# Patient Record
Sex: Female | Born: 1955
Health system: Southern US, Community
[De-identification: ages and names within clinical notes are randomized; demographics above are authoritative.]

## PROBLEM LIST (undated history)

## (undated) DIAGNOSIS — I341 Nonrheumatic mitral (valve) prolapse: Secondary | ICD-10-CM

## (undated) DIAGNOSIS — Z9989 Dependence on other enabling machines and devices: Secondary | ICD-10-CM

## (undated) DIAGNOSIS — G8114 Spastic hemiplegia affecting left nondominant side: Secondary | ICD-10-CM

## (undated) DIAGNOSIS — Z972 Presence of dental prosthetic device (complete) (partial): Secondary | ICD-10-CM

## (undated) DIAGNOSIS — R519 Headache, unspecified: Secondary | ICD-10-CM

## (undated) DIAGNOSIS — Z8489 Family history of other specified conditions: Secondary | ICD-10-CM

## (undated) DIAGNOSIS — N819 Female genital prolapse, unspecified: Secondary | ICD-10-CM

## (undated) HISTORY — DX: Nonrheumatic mitral (valve) prolapse: I34.1

## (undated) HISTORY — PX: FINGER SURGERY: SHX640

## (undated) HISTORY — PX: TUBAL LIGATION: SHX77

## (undated) HISTORY — PX: GALLBLADDER SURGERY: SHX652

---
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
## 1995-01-14 HISTORY — PX: TUBAL LIGATION: SHX77

## 2011-01-14 HISTORY — PX: CHOLECYSTECTOMY: SHX55

## 2015-01-14 HISTORY — PX: FINGER SURGERY: SHX640

## 2015-05-31 ENCOUNTER — Ambulatory Visit (INDEPENDENT_AMBULATORY_CARE_PROVIDER_SITE_OTHER): Payer: BLUE CROSS/BLUE SHIELD | Admitting: Sports Medicine

## 2015-05-31 ENCOUNTER — Ambulatory Visit
Admission: RE | Admit: 2015-05-31 | Discharge: 2015-05-31 | Disposition: A | Discharge status: Home / Self Care | Payer: BLUE CROSS/BLUE SHIELD | Source: Ambulatory Visit | Attending: Sports Medicine | Admitting: Sports Medicine

## 2015-05-31 ENCOUNTER — Encounter: Payer: Self-pay | Admitting: Sports Medicine

## 2015-05-31 VITALS — BP 110/70 | Ht 66.0 in | Wt 142.0 lb

## 2015-05-31 DIAGNOSIS — R29898 Other symptoms and signs involving the musculoskeletal system: Secondary | ICD-10-CM

## 2015-05-31 NOTE — Progress Notes (Signed)
   Subjective:    Patient ID: Heather [name], female    DOB: [birth year], 60 y.o.   MRN: [id number]  HPI  CC: left leg weakness  Heather [name] is a very pleasant 60-y.o. female who presents with complaints of gradually worsening left leg weakness.  She states that her symptoms have been present for the past 5 years.  She denies any trauma or inciting event related to the onset of her symptoms.  She notes that her left leg fatigues easily with activity (i.e., walking for long distances).  Associated symptoms include some occasional left-sided, "aching" low back pain; she denies any pain or numbness/tingling in her left leg.  She has noticed that her left leg seems "smaller" than her right leg and that she occasionally stumbles after walking long distances due to the perceived weakness in her left leg.  She has continued to stay active despite her symptoms.  She has not taken any medication for her symptoms.  She does report a history of limb length discrepancy in her family and was recently told by her PCP that her left leg is slightly shorter than her right leg.   Past medical and surgical history unremarkable. Medications reviewed. Allergies reviewed.   Review of Systems As noted in HPI.    Objective:   Physical Exam  Well-nourished, well-developed.  Alert and oriented x3.  Vital signs reviewed.  Left leg: Visible atrophy of calf and quadriceps muscles as compared to right side.  Upon measurement, the left leg is 38cm at the mid-thigh (compared to 43cm on the right side) and 34cm at the calf (compared to 36cm on the right side).  No tenderness to palpation.  Left leg is slightly shorter than the right leg by comparison at the level of the medial malleoli.  Resisted hamstring flexion on the left is 4+/5; 5/5 on the right.  Quad strength 5/5 bilaterally.  Left side is weak on resisted dorsiflexion and extensor hallucis longus testing as compared to the right.  Visible fasciculations in the left thigh.   Mild hyperreflexia at patellar and Achilles tendons as compared to right side.  Left lower extremity appears grossly neurovascularly intact.  Lumbar spine: Full lumbar spine range of motion without discomfort or limitation.     Assessment & Plan:   Left lower extremity weakness with concern for L5-S1 nerve root compression  Patient's symptoms seem to mostly follow distribution for L5-S1 nerve involvement, though this is not completely clear.  We will send the patient for lumbar spine x-rays and lumbar spine MRI in hopes of better deciphering the etiology of her symptoms.  If MRI shows no cause for patient's symptoms, will likely need to send her to Neurology for further evaluation and possible nerve conduction studies.  We will follow-up with her by phone once her imaging results are received.  Jenna E. Anola Gurney, M.D.  Patient seen and evaluated with the resident. I agree with the above plan of care. Patient presents today with chronic left leg weakness and obvious atrophy on exam. I'm concerned that she has a chronic lumbar disc herniation so I'm going to get an MRI of her lumbar spine. If lumbar disc herniation is confirmed, she will likely need neurosurgical referral. If the MRI is unremarkable, then I would consider referral to neurology. I think she is okay to continue with activity as her symptoms allow since this is a chronic condition.

## 2015-06-07 ENCOUNTER — Ambulatory Visit
Admission: RE | Admit: 2015-06-07 | Discharge: 2015-06-07 | Disposition: A | Discharge status: Home / Self Care | Payer: BLUE CROSS/BLUE SHIELD | Source: Ambulatory Visit | Attending: Sports Medicine | Admitting: Sports Medicine

## 2015-06-07 DIAGNOSIS — R29898 Other symptoms and signs involving the musculoskeletal system: Secondary | ICD-10-CM

## 2015-06-12 ENCOUNTER — Telehealth: Payer: Self-pay | Admitting: Sports Medicine

## 2015-06-12 NOTE — Telephone Encounter (Signed)
I spoke with the patient on the phone today after reviewing the x-rays and the MRI of her lumbar spine. MRI does show a broad-based extraforaminal disc protrusion at L3-L4 on the left which could contribute to L3 nerve root encroachment. She also has a grade 1 spondylolisthesis at L4-L5 but no significant spinal or foraminal encroachment. I'm not totally convinced that the disc protrusion at L3-L4 explains the degree of weakness and atrophy that she has in her left leg. Therefore, I would like to proceed with an EMG/nerve conduction study to evaluate further. I will call her with the results of that study once available at which point we will delineate further workup and treatment.

## 2015-07-10 DIAGNOSIS — S61216A Laceration without foreign body of right little finger without damage to nail, initial encounter: Secondary | ICD-10-CM | POA: Diagnosis not present

## 2015-07-10 DIAGNOSIS — S66126A Laceration of flexor muscle, fascia and tendon of right little finger at wrist and hand level, initial encounter: Secondary | ICD-10-CM | POA: Diagnosis not present

## 2015-07-11 DIAGNOSIS — S66126A Laceration of flexor muscle, fascia and tendon of right little finger at wrist and hand level, initial encounter: Secondary | ICD-10-CM | POA: Diagnosis not present

## 2015-07-11 DIAGNOSIS — S61216A Laceration without foreign body of right little finger without damage to nail, initial encounter: Secondary | ICD-10-CM | POA: Diagnosis not present

## 2015-07-12 ENCOUNTER — Encounter: Payer: Self-pay | Admitting: Neurology

## 2015-07-12 ENCOUNTER — Ambulatory Visit (INDEPENDENT_AMBULATORY_CARE_PROVIDER_SITE_OTHER): Payer: Self-pay | Admitting: Neurology

## 2015-07-12 ENCOUNTER — Ambulatory Visit (INDEPENDENT_AMBULATORY_CARE_PROVIDER_SITE_OTHER): Payer: BLUE CROSS/BLUE SHIELD | Admitting: Neurology

## 2015-07-12 DIAGNOSIS — R29898 Other symptoms and signs involving the musculoskeletal system: Secondary | ICD-10-CM

## 2015-07-12 NOTE — Procedures (Signed)
     HISTORY:  Heather [name] is a 60 year old patient with a history of problems with limping on the left leg, hyperextension of the left knee, some weakness of the leg. The patient has possible left L3 nerve root impingement by MRI of the lumbar spine. The patient is being evaluated for a possible lumbar radiculopathy.  NERVE CONDUCTION STUDIES:  Nerve conduction studies were performed on both lower extremities. The distal motor latencies and motor amplitudes for the peroneal and posterior tibial nerves were within normal limits. The nerve conduction velocities for these nerves were also normal. The H reflex latencies were normal. The sensory latencies for the peroneal nerves were within normal limits.   EMG STUDIES:  EMG study was performed on the left lower extremity:  The tibialis anterior muscle reveals 2 to 4K motor units with full recruitment. No fibrillations or positive waves were seen. The peroneus tertius muscle reveals 2 to 4K motor units with full recruitment. No fibrillations or positive waves were seen. The medial gastrocnemius muscle reveals 1 to 3K motor units with full recruitment. No fibrillations or positive waves were seen. The vastus lateralis muscle reveals 2 to 4K motor units with full recruitment. No fibrillations or positive waves were seen. The iliopsoas muscle reveals 2 to 4K motor units with full recruitment. No fibrillations or positive waves were seen. The biceps femoris muscle (long head) reveals 2 to 4K motor units with full recruitment. No fibrillations or positive waves were seen. The lumbosacral paraspinal muscles were tested at 3 levels, and revealed no abnormalities of insertional activity at all 3 levels tested. There was good relaxation.   IMPRESSION:  Nerve conduction studies done on both lower extremities were within normal limits. No evidence of a peripheral neuropathy is seen. EMG evaluation of the left lower extremity is unremarkable, without  evidence of an overlying lumbar radiculopathy.  Heather Palau MD 07/12/2015 11:05 AM  [facility] [address] [place], [place] [address]  Phone [phone] Fax [phone]

## 2015-07-12 NOTE — Progress Notes (Signed)
Please refer to EMG and nerve conduction study procedure note. 

## 2015-07-16 DIAGNOSIS — M25641 Stiffness of right hand, not elsewhere classified: Secondary | ICD-10-CM | POA: Diagnosis not present

## 2015-07-24 DIAGNOSIS — S66126A Laceration of flexor muscle, fascia and tendon of right little finger at wrist and hand level, initial encounter: Secondary | ICD-10-CM | POA: Diagnosis not present

## 2015-07-31 ENCOUNTER — Ambulatory Visit: Payer: BLUE CROSS/BLUE SHIELD | Admitting: Sports Medicine

## 2015-08-01 ENCOUNTER — Ambulatory Visit (INDEPENDENT_AMBULATORY_CARE_PROVIDER_SITE_OTHER): Payer: BLUE CROSS/BLUE SHIELD | Admitting: Sports Medicine

## 2015-08-01 ENCOUNTER — Encounter: Payer: Self-pay | Admitting: Sports Medicine

## 2015-08-01 VITALS — BP 123/74 | HR 68 | Ht 66.0 in | Wt 140.0 lb

## 2015-08-01 DIAGNOSIS — R29898 Other symptoms and signs involving the musculoskeletal system: Secondary | ICD-10-CM | POA: Diagnosis not present

## 2015-08-01 NOTE — Progress Notes (Signed)
   Subjective:    Patient ID: Heather [name], female    DOB: 1955-04-21, 60 y.o.   MRN: [id number]  HPI  Heather [name] is here for follow-up for left leg weakness. She denies any pain numbness or tingling, but states she has to swing her leg to the outside after walking for a long period. Her father has similar symptoms and also has to "drag his left leg". During her last visit she was noted to have some atrophy of her left thigh and calf as well as weakness compared to the right side. MRI lumbar spine did not show signs of significant nerve impingement. Recent EMG/ NCS was unremarkable. Patient states weakness has been going on for 5 years. Only significant injury to this leg in the past was a patellar fracture which was treated conservatively with knee immobilization but no post injury physical therapy was ordered. Patient's daughter is present for today's visit as well as.  FH: No history of nerve or muscle disease  MRI Lumbar spine IMPRESSION: 1. Broad-based extraforaminal disc protrusion on the left at L3-4 could contribute to extraforaminal left L3 nerve root encroachment. 2. Moderate facet hypertrophy at L4-5 with a resulting grade 1 anterolisthesis and mild disc bulging. No associated spinal stenosis or nerve root encroachment. 3. Mild disc degeneration at L5-S1 without resulting spinal stenosis or nerve root encroachment.  Review of Systems Denies numbness, tingling, pain, swelling, rash    Objective:   Physical Exam Patient appears well in no acute distress. Has a brace on right hand from recent surgery. Left thigh notably smaller than right. No obvious deformity, ecchymoses, or swelling of left lower extremity. Strength:  Hip flexion: 5/5 right, 4/5 left Hip abduction: 5/5 bilaterally Hip adduction: 5/5 bilaterally Knee extension: 5/5 bilaterally Knee flexion: 5/5 right, 4+/5 left Ankle strength 5 out of 5 in all directions bilaterally Neuro: Sensation intact to light touch  throughout. 3+ patellar and Achilles reflexes bilaterally slightly more prominent on left  Left mid thigh measures 40.5 cm Right mid thigh measures 43.5 cm  Left calf measures 34 cm Right calf measures 36 cm     Assessment & Plan:  Left leg weakness - Patient continues to have left leg weakness and atrophy despite normal EMG and MRI with no significant nerve impingement - Although the patient's EMG/nerve conduction study is unremarkable, I would like to refer the patient to neurology just to make sure that there is not some sort of neurological process as the etiology of her chronic weakness and atrophy. If her neurology workup is also unremarkable, then I will feel more comfortable attributing this to her remote patellar fracture and lack of post injury physical therapy. If that is the case, I will order formal physical therapy for strengthening of the entire left lower leg. I've asked the patient to follow-up with me after her neurology appointment.

## 2015-08-16 DIAGNOSIS — S66126A Laceration of flexor muscle, fascia and tendon of right little finger at wrist and hand level, initial encounter: Secondary | ICD-10-CM | POA: Diagnosis not present

## 2015-08-20 DIAGNOSIS — L910 Hypertrophic scar: Secondary | ICD-10-CM | POA: Diagnosis not present

## 2015-08-20 DIAGNOSIS — M25641 Stiffness of right hand, not elsewhere classified: Secondary | ICD-10-CM | POA: Diagnosis not present

## 2015-08-24 DIAGNOSIS — M25641 Stiffness of right hand, not elsewhere classified: Secondary | ICD-10-CM | POA: Diagnosis not present

## 2015-08-24 DIAGNOSIS — S66126A Laceration of flexor muscle, fascia and tendon of right little finger at wrist and hand level, initial encounter: Secondary | ICD-10-CM | POA: Diagnosis not present

## 2015-08-24 DIAGNOSIS — L910 Hypertrophic scar: Secondary | ICD-10-CM | POA: Diagnosis not present

## 2015-08-27 DIAGNOSIS — M25641 Stiffness of right hand, not elsewhere classified: Secondary | ICD-10-CM | POA: Diagnosis not present

## 2015-08-27 DIAGNOSIS — L910 Hypertrophic scar: Secondary | ICD-10-CM | POA: Diagnosis not present

## 2015-08-29 DIAGNOSIS — L570 Actinic keratosis: Secondary | ICD-10-CM | POA: Diagnosis not present

## 2015-08-30 DIAGNOSIS — M25649 Stiffness of unspecified hand, not elsewhere classified: Secondary | ICD-10-CM | POA: Diagnosis not present

## 2015-08-30 DIAGNOSIS — M25641 Stiffness of right hand, not elsewhere classified: Secondary | ICD-10-CM | POA: Diagnosis not present

## 2015-09-04 DIAGNOSIS — S66126A Laceration of flexor muscle, fascia and tendon of right little finger at wrist and hand level, initial encounter: Secondary | ICD-10-CM | POA: Diagnosis not present

## 2015-09-04 DIAGNOSIS — M25649 Stiffness of unspecified hand, not elsewhere classified: Secondary | ICD-10-CM | POA: Diagnosis not present

## 2015-09-06 ENCOUNTER — Ambulatory Visit: Payer: BLUE CROSS/BLUE SHIELD | Admitting: Neurology

## 2015-09-13 DIAGNOSIS — M25649 Stiffness of unspecified hand, not elsewhere classified: Secondary | ICD-10-CM | POA: Diagnosis not present

## 2015-09-13 DIAGNOSIS — M25641 Stiffness of right hand, not elsewhere classified: Secondary | ICD-10-CM | POA: Diagnosis not present

## 2015-09-20 DIAGNOSIS — M25641 Stiffness of right hand, not elsewhere classified: Secondary | ICD-10-CM | POA: Diagnosis not present

## 2015-09-20 DIAGNOSIS — M25649 Stiffness of unspecified hand, not elsewhere classified: Secondary | ICD-10-CM | POA: Diagnosis not present

## 2015-10-04 DIAGNOSIS — M25649 Stiffness of unspecified hand, not elsewhere classified: Secondary | ICD-10-CM | POA: Diagnosis not present

## 2015-10-04 DIAGNOSIS — M25641 Stiffness of right hand, not elsewhere classified: Secondary | ICD-10-CM | POA: Diagnosis not present

## 2015-11-01 DIAGNOSIS — S66126D Laceration of flexor muscle, fascia and tendon of right little finger at wrist and hand level, subsequent encounter: Secondary | ICD-10-CM | POA: Diagnosis not present

## 2015-12-25 DIAGNOSIS — H524 Presbyopia: Secondary | ICD-10-CM | POA: Diagnosis not present

## 2015-12-27 DIAGNOSIS — Z789 Other specified health status: Secondary | ICD-10-CM | POA: Diagnosis not present

## 2015-12-27 DIAGNOSIS — Z0000 Encounter for general adult medical examination without abnormal findings: Secondary | ICD-10-CM | POA: Diagnosis not present

## 2017-01-13 HISTORY — PX: COLONOSCOPY: SHX174

## 2017-04-23 DIAGNOSIS — Z131 Encounter for screening for diabetes mellitus: Secondary | ICD-10-CM | POA: Diagnosis not present

## 2017-04-23 DIAGNOSIS — Z124 Encounter for screening for malignant neoplasm of cervix: Secondary | ICD-10-CM | POA: Diagnosis not present

## 2017-04-23 DIAGNOSIS — Z1159 Encounter for screening for other viral diseases: Secondary | ICD-10-CM | POA: Diagnosis not present

## 2017-04-23 DIAGNOSIS — Z13220 Encounter for screening for lipoid disorders: Secondary | ICD-10-CM | POA: Diagnosis not present

## 2017-04-23 DIAGNOSIS — Z0000 Encounter for general adult medical examination without abnormal findings: Secondary | ICD-10-CM | POA: Diagnosis not present

## 2017-08-08 IMAGING — MR MR LUMBAR SPINE W/O CM
4 of 5 series · 19 of 48 positions shown · non-contrast
Comparison: Lumbar spine radiographs 05/31/2015

CLINICAL DATA: Chronic left leg weakness and limping. Occasional
low left-sided back pain. No acute injury or prior relevant surgery.

EXAM:
MRI LUMBAR SPINE WITHOUT CONTRAST
TECHNIQUE: Multiplanar, multisequence MR imaging of the lumbar spine was
performed. No intravenous contrast was administered.

[Series 6: T2 · sagittal · 4.0mm · 0.73mm/px · 7 of 15 slices shown (1 of 2)]
[im 1/15]
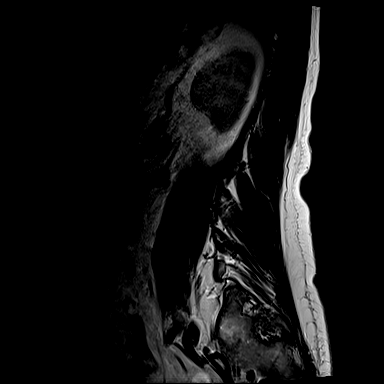
[im 3/15]
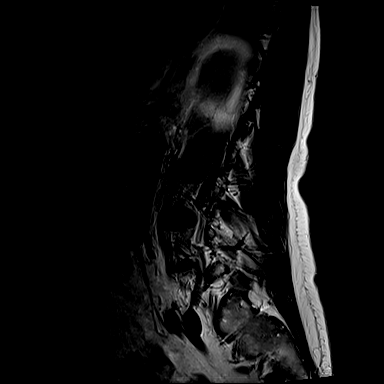
[im 5/15]
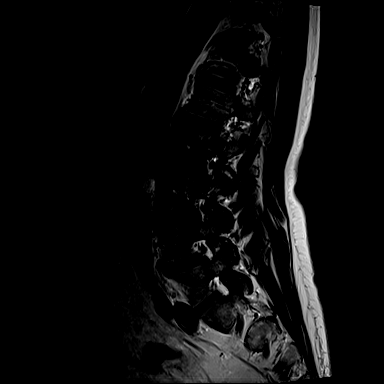
[im 8/15]
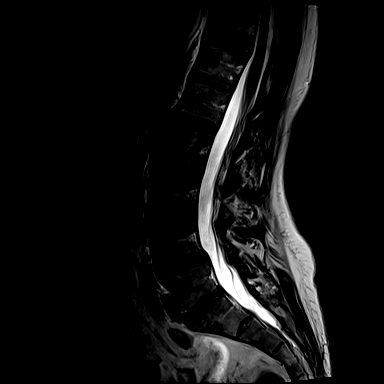
[im 10/15]
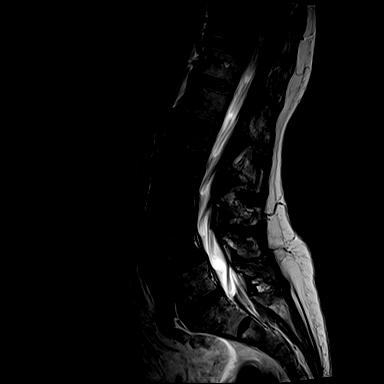
[im 12/15]
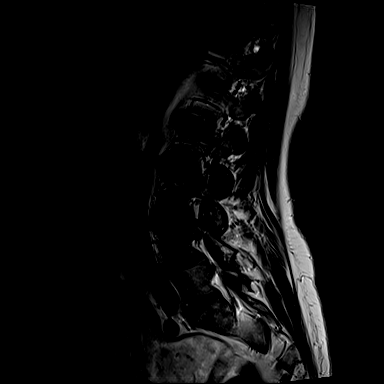
[im 15/15]
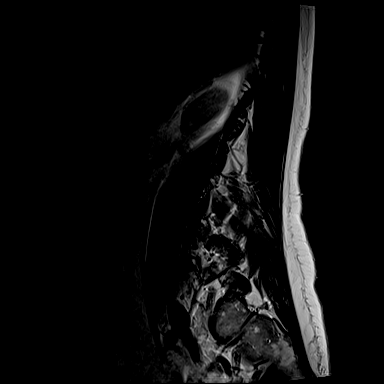

[Series 8: T1 · sagittal · 4.0mm · 0.73mm/px · 3 of 15 slices shown (1 of 2)]
[im 3/15]
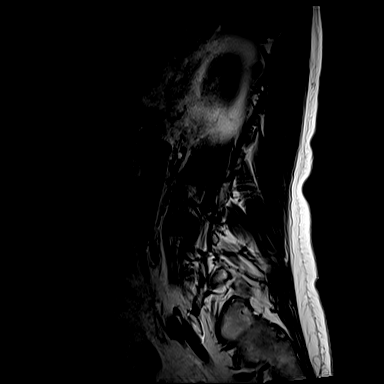
[im 9/15]
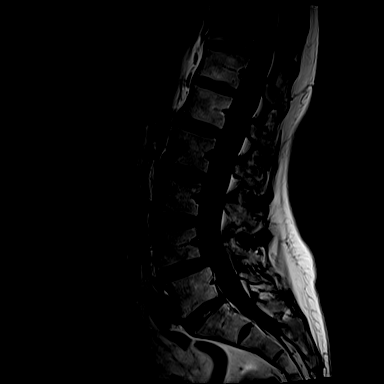
[im 15/15]
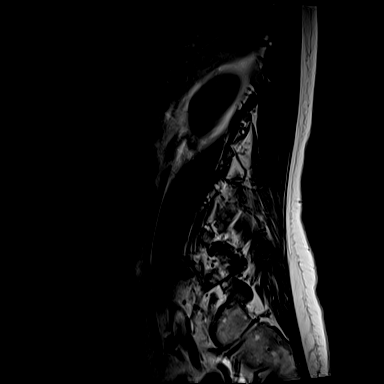

[Series 11: T1 · axial · 4.0mm · 0.28mm/px · z∈[-42,+103]mm · 3 of 34 slices shown (2 of 2)]
[im 6/34]
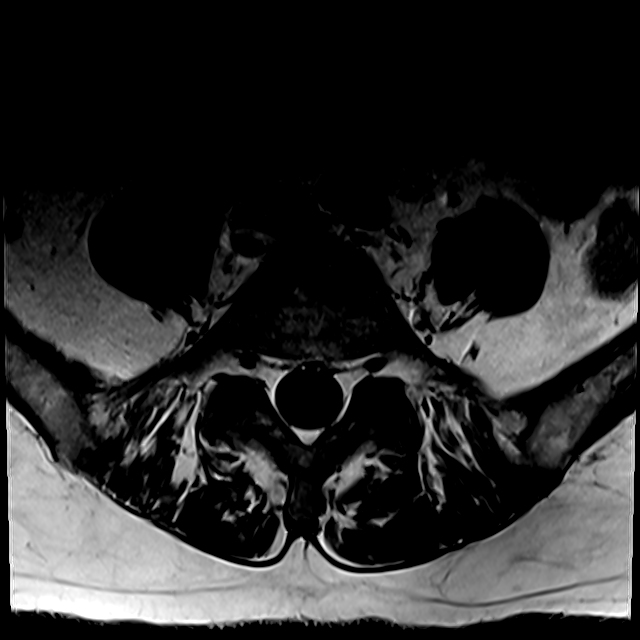
[im 18/34]
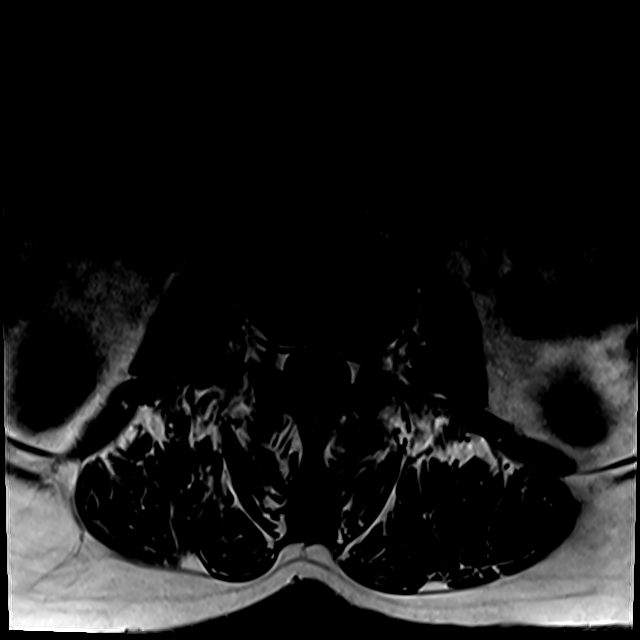
[im 28/34]
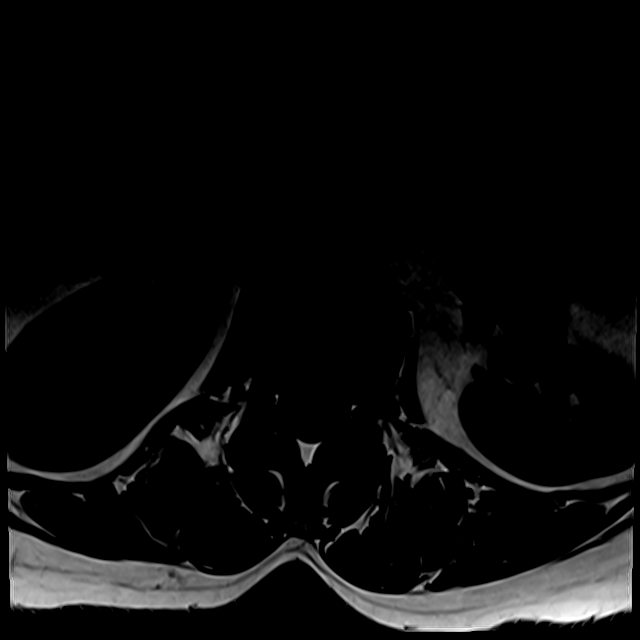

[Series 15: T2 · axial · 4.0mm · 0.28mm/px · z∈[-67,+103]mm · 6 of 34 slices shown (2 of 2)]
[im 1/34]
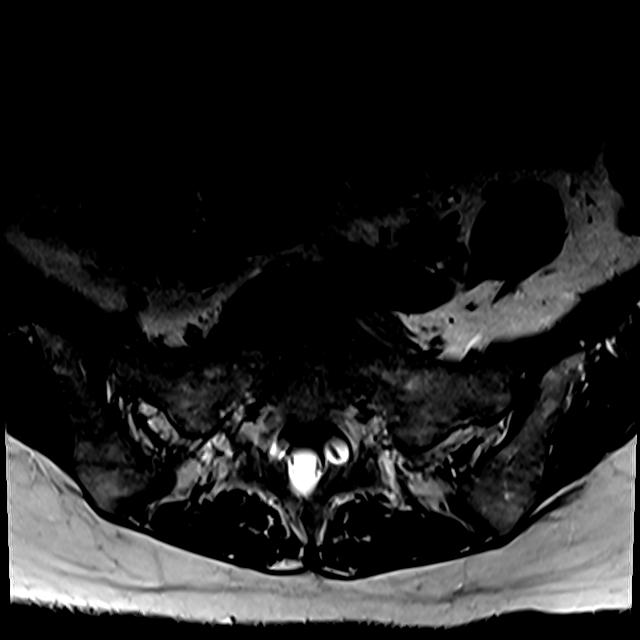
[im 6/34]
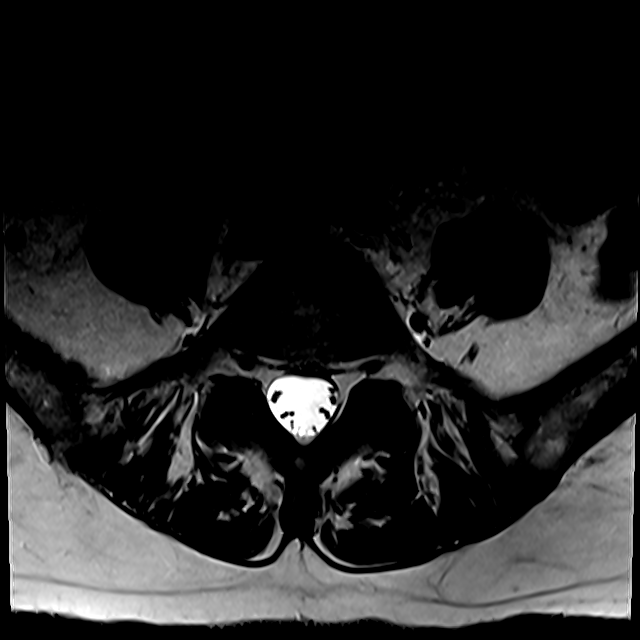
[im 11/34]
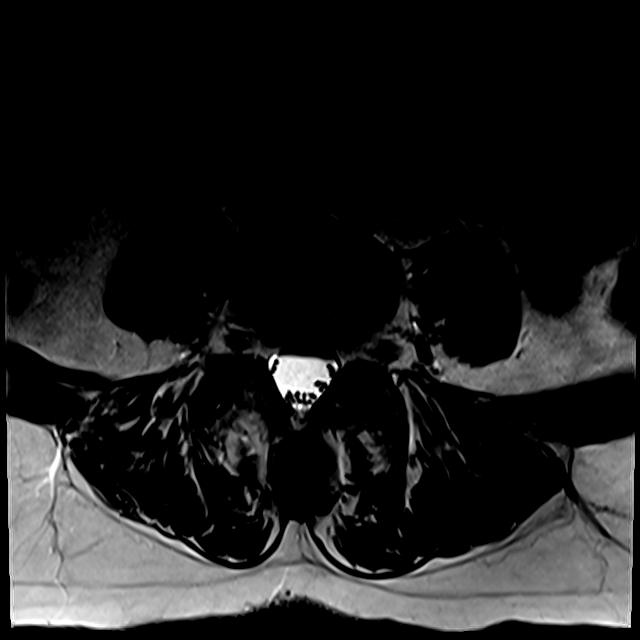
[im 16/34]
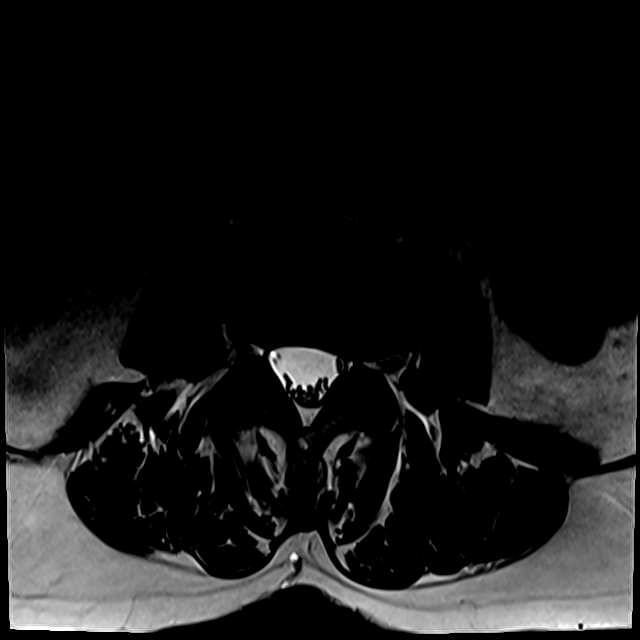
[im 18/34]
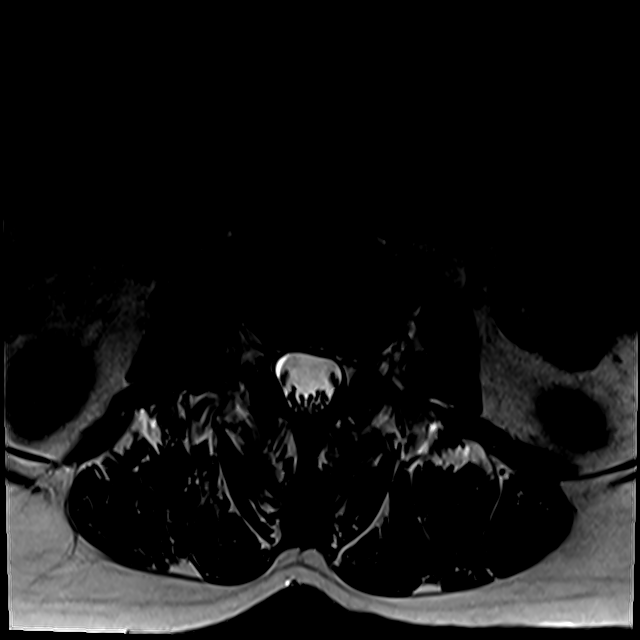
[im 28/34]
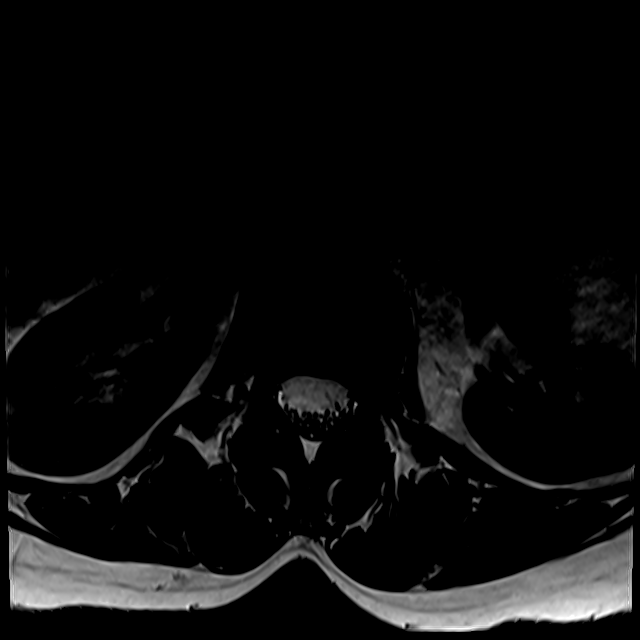

[19 of 48 positions shown; findings below may reference images not displayed]

FINDINGS: Segmentation: Conventional anatomy assumed, as on radiographs, with
the last open disc space designated L5-S1.

Alignment: 3 mm of anterolisthesis at L4-5 secondary to facet
disease. Otherwise normal.

Vertebrae: No worrisome osseous lesion, acute fracture or pars
defect. The visualized sacroiliac joints appear unremarkable.

Conus medullaris: Extends to the L1 level and appears normal.

Paraspinal and other soft tissues: No significant paraspinal
findings. Probable small incidental lymphocele anterior to the L3
vertebral body, best seen on the sagittal images.

Disc levels:

There are no significant disc space findings from T11-12 through
L2-3.

L3-4: Disc height and hydration are relatively maintained. There is
a broad-based left lateral disc protrusion which is most obvious on
the sagittal images. There is no significant foraminal narrowing,
although this could contribute to extraforaminal left L3 nerve root
encroachment. The lateral recesses and foramen are patent.

L4-5: Moderate bilateral facet hypertrophy, accounting for the grade
1 anterolisthesis. There is associated bone marrow edema in the
posterior elements, asymmetric to the right. There is mild loss of
disc height with mild annular disc bulging. No significant spinal
stenosis or nerve root encroachment.

L5-S1: Mild disc desiccation, disc bulging and facet hypertrophy. No
spinal stenosis or nerve encroachment.
IMPRESSION: 1. Broad-based extraforaminal disc protrusion on the left at L3-4
could contribute to extraforaminal left L3 nerve root encroachment.
2. Moderate facet hypertrophy at L4-5 with a resulting grade 1
anterolisthesis and mild disc bulging. No associated spinal stenosis
or nerve root encroachment.
3. Mild disc degeneration at L5-S1 without resulting spinal stenosis
or nerve root encroachment.

## 2017-08-12 DIAGNOSIS — Z1211 Encounter for screening for malignant neoplasm of colon: Secondary | ICD-10-CM | POA: Diagnosis not present

## 2017-08-12 DIAGNOSIS — K5730 Diverticulosis of large intestine without perforation or abscess without bleeding: Secondary | ICD-10-CM | POA: Diagnosis not present

## 2017-08-12 DIAGNOSIS — D125 Benign neoplasm of sigmoid colon: Secondary | ICD-10-CM | POA: Diagnosis not present

## 2017-08-12 DIAGNOSIS — K635 Polyp of colon: Secondary | ICD-10-CM | POA: Diagnosis not present

## 2017-11-16 DIAGNOSIS — Z23 Encounter for immunization: Secondary | ICD-10-CM | POA: Diagnosis not present

## 2017-12-22 DIAGNOSIS — Z1231 Encounter for screening mammogram for malignant neoplasm of breast: Secondary | ICD-10-CM | POA: Diagnosis not present

## 2018-08-02 DIAGNOSIS — Z1331 Encounter for screening for depression: Secondary | ICD-10-CM | POA: Diagnosis not present

## 2018-08-02 DIAGNOSIS — Z0000 Encounter for general adult medical examination without abnormal findings: Secondary | ICD-10-CM | POA: Diagnosis not present

## 2018-08-02 DIAGNOSIS — Z131 Encounter for screening for diabetes mellitus: Secondary | ICD-10-CM | POA: Diagnosis not present

## 2018-08-02 DIAGNOSIS — Z6822 Body mass index (BMI) 22.0-22.9, adult: Secondary | ICD-10-CM | POA: Diagnosis not present

## 2018-08-02 DIAGNOSIS — E785 Hyperlipidemia, unspecified: Secondary | ICD-10-CM | POA: Diagnosis not present

## 2018-09-07 DIAGNOSIS — M25362 Other instability, left knee: Secondary | ICD-10-CM | POA: Diagnosis not present

## 2018-09-07 DIAGNOSIS — M25562 Pain in left knee: Secondary | ICD-10-CM | POA: Diagnosis not present

## 2018-09-07 DIAGNOSIS — M5416 Radiculopathy, lumbar region: Secondary | ICD-10-CM | POA: Diagnosis not present

## 2018-09-30 DIAGNOSIS — M6281 Muscle weakness (generalized): Secondary | ICD-10-CM | POA: Diagnosis not present

## 2018-09-30 DIAGNOSIS — Z6822 Body mass index (BMI) 22.0-22.9, adult: Secondary | ICD-10-CM | POA: Diagnosis not present

## 2018-09-30 DIAGNOSIS — R2681 Unsteadiness on feet: Secondary | ICD-10-CM | POA: Diagnosis not present

## 2018-09-30 DIAGNOSIS — R29898 Other symptoms and signs involving the musculoskeletal system: Secondary | ICD-10-CM | POA: Diagnosis not present

## 2018-10-07 DIAGNOSIS — M6281 Muscle weakness (generalized): Secondary | ICD-10-CM | POA: Diagnosis not present

## 2018-10-07 DIAGNOSIS — R293 Abnormal posture: Secondary | ICD-10-CM | POA: Diagnosis not present

## 2018-10-07 DIAGNOSIS — R2689 Other abnormalities of gait and mobility: Secondary | ICD-10-CM | POA: Diagnosis not present

## 2018-10-13 DIAGNOSIS — R2689 Other abnormalities of gait and mobility: Secondary | ICD-10-CM | POA: Diagnosis not present

## 2018-10-13 DIAGNOSIS — R293 Abnormal posture: Secondary | ICD-10-CM | POA: Diagnosis not present

## 2018-10-13 DIAGNOSIS — M6281 Muscle weakness (generalized): Secondary | ICD-10-CM | POA: Diagnosis not present

## 2018-10-18 DIAGNOSIS — M6281 Muscle weakness (generalized): Secondary | ICD-10-CM | POA: Diagnosis not present

## 2018-10-18 DIAGNOSIS — R2689 Other abnormalities of gait and mobility: Secondary | ICD-10-CM | POA: Diagnosis not present

## 2018-10-18 DIAGNOSIS — R293 Abnormal posture: Secondary | ICD-10-CM | POA: Diagnosis not present

## 2018-10-25 DIAGNOSIS — R293 Abnormal posture: Secondary | ICD-10-CM | POA: Diagnosis not present

## 2018-10-25 DIAGNOSIS — R2689 Other abnormalities of gait and mobility: Secondary | ICD-10-CM | POA: Diagnosis not present

## 2018-10-25 DIAGNOSIS — M6281 Muscle weakness (generalized): Secondary | ICD-10-CM | POA: Diagnosis not present

## 2018-11-01 ENCOUNTER — Ambulatory Visit: Payer: BC Managed Care – PPO | Admitting: Neurology

## 2018-11-01 ENCOUNTER — Encounter: Payer: Self-pay | Admitting: Neurology

## 2018-11-01 ENCOUNTER — Other Ambulatory Visit: Payer: Self-pay

## 2018-11-01 VITALS — BP 111/74 | HR 83 | Temp 97.7°F | Ht 66.0 in | Wt 140.0 lb

## 2018-11-01 DIAGNOSIS — Z5181 Encounter for therapeutic drug level monitoring: Secondary | ICD-10-CM | POA: Diagnosis not present

## 2018-11-01 DIAGNOSIS — G8114 Spastic hemiplegia affecting left nondominant side: Secondary | ICD-10-CM | POA: Diagnosis not present

## 2018-11-01 DIAGNOSIS — G3281 Cerebellar ataxia in diseases classified elsewhere: Secondary | ICD-10-CM

## 2018-11-01 HISTORY — DX: Spastic hemiplegia affecting left nondominant side: G81.14

## 2018-11-01 NOTE — Progress Notes (Signed)
Reason for visit: Left leg weakness  Referring physician: Dr. Arnoldo Lenis is a 63 y.o. female  History of present illness:  [name] is a 63 year old right-handed white female with a 7-year history of a gradually progressive problem with walking with her left leg.  She has weakness of the left leg and she has over time developed atrophy of the thigh and calf muscle.  She reports no discomfort in the left leg, she denies any significant low back pain, she does have some right-sided neck pain on occasion but relates this to severe TMJ issues on the right.  The patient apparently fell 7 years ago and fractured her patella on the left, she has had some alteration walking since that time but this has gradually worsened.  The patient reports no difficulty controlling the bowels or the bladder.  She has no visual changes or headaches.  She has undergone MRI of the lumbar spine in 2017 that did not show a source for her left leg weakness.  She had EMG and nerve conduction study done through this office in June 2017, the study was unremarkable.  The patient is sent back to this office for further evaluation.  Past Medical History:  Diagnosis Date   Left spastic hemiparesis (HCC) 11/01/2018    Past Surgical History:  Procedure Laterality Date   FINGER SURGERY     GALLBLADDER SURGERY     TUBAL LIGATION      History reviewed. No pertinent family history.  Social history:  reports that she has never smoked. She has never used smokeless tobacco. She reports that she does not drink alcohol or use drugs.  Medications:  Prior to Admission medications   Not on File      Allergies  Allergen Reactions   Erythromycin    Penicillins     ROS:  Out of a complete 14 system review of symptoms, the patient complains only of the following symptoms, and all other reviewed systems are negative.  Walking difficulty Left knee pain Heart murmur, mitral valve prolapse Right  temporomandibular joint pain Weakness  Blood pressure 111/74, pulse 83, temperature 97.7 F (36.5 C), temperature source Temporal, height 5\' 6"  (1.676 m), weight 140 lb (63.5 kg).  Physical Exam  General: The patient is alert and cooperative at the time of the examination.  Eyes: Pupils are equal, round, and reactive to light. Discs are flat bilaterally.  Neck: The neck is supple, no carotid bruits are noted.  Respiratory: The respiratory examination is clear.  Cardiovascular: The cardiovascular examination reveals a regular rate and rhythm, no obvious murmurs or rubs are noted.   Neuromuscular: Atrophy of the left thigh and calf muscle is seen.  Skin: Extremities are without significant edema.  Neurologic Exam  Mental status: The patient is alert and oriented x 3 at the time of the examination. The patient has apparent normal recent and remote memory, with an apparently normal attention span and concentration ability.  Cranial nerves: Facial symmetry is present. There is good sensation of the face to pinprick and soft touch bilaterally. The strength of the facial muscles and the muscles to head turning and shoulder shrug are normal bilaterally. Speech is well enunciated, no aphasia or dysarthria is noted. Extraocular movements are full. Visual fields are full. The tongue is midline, and the patient has symmetric elevation of the soft palate. No obvious hearing deficits are noted.  Motor: The motor testing reveals 5 over 5 strength of the right extremities.  On the left side, there is 4/5 strength with the intrinsic muscles of the left hand, 4+/5 strength with deltoid muscles and external rotation, the biceps and triceps are good strength.  With the left lower extremity, there is 4 -/5 strength with hip flexion and with hamstring strength, with a prominent foot drop on the left.  Knee extension is of normal strength.  Good symmetric motor tone is noted throughout.  Sensory: Sensory  testing is intact to pinprick, soft touch, vibration sensation, and position sense on all 4 extremities. No evidence of extinction is noted.  Coordination: Cerebellar testing reveals good finger-nose-finger and heel-to-shin bilaterally.  Gait and station: Gait is associated with a prominent circumduction gait with the left leg.  Tandem gait was not attempted.  Romberg is negative.  Reflexes: Deep tendon reflexes somewhat brisk throughout, but the left biceps reflexes more prominent in the right, there is an upgoing toe on the left, downgoing on the right.  3-4 beats of ankle clonus are seen bilaterally.   MRI lumbar 06/08/15:  IMPRESSION: 1. Broad-based extraforaminal disc protrusion on the left at L3-4 could contribute to extraforaminal left L3 nerve root encroachment. 2. Moderate facet hypertrophy at L4-5 with a resulting grade 1 anterolisthesis and mild disc bulging. No associated spinal stenosis or nerve root encroachment. 3. Mild disc degeneration at L5-S1 without resulting spinal stenosis or nerve root encroachment.  * MRI scan images were reviewed online. I agree with the written report.    Assessment/Plan:  1.  Spastic left hemiparesis, leg greater than arm  2.  Gait disorder  The patient has evidence of a left sided spastic hemiparesis that by history has been gradually worsening over time.  The leg is greater affected than the arm, would consider a parafalcine process such as meningioma or demyelinating disease.  Spinal cord compression does need to be excluded.  The patient will be set up for blood work today, she will have MRI of the brain and cervical spinal cord with and without gadolinium enhancement.  She will follow-up here in 4 months.  The patient is engaged in physical therapy currently.  Jill Alexanders MD 11/01/2018 10:05 AM  [facility] [address] [place],  [address]  Phone [phone] Fax [phone]

## 2018-11-02 ENCOUNTER — Telehealth: Payer: Self-pay

## 2018-11-02 LAB — COMPREHENSIVE METABOLIC PANEL
ALT: 13 IU/L (ref 0–32)
AST: 14 IU/L (ref 0–40)
Albumin/Globulin Ratio: 1.9 (ref 1.2–2.2)
Albumin: 4.5 g/dL (ref 3.8–4.8)
Alkaline Phosphatase: 65 IU/L (ref 39–117)
BUN/Creatinine Ratio: 13 (ref 12–28)
BUN: 9 mg/dL (ref 8–27)
Bilirubin Total: 0.4 mg/dL (ref 0.0–1.2)
CO2: 24 mmol/L (ref 20–29)
Calcium: 9.6 mg/dL (ref 8.7–10.3)
Chloride: 106 mmol/L (ref 96–106)
Creatinine, Ser: 0.68 mg/dL (ref 0.57–1.00)
GFR calc Af Amer: 107 mL/min/{1.73_m2} (ref >59)
GFR calc non Af Amer: 93 mL/min/{1.73_m2} (ref >59)
Globulin, Total: 2.4 g/dL (ref 1.5–4.5)
Glucose: 100 mg/dL — ABNORMAL HIGH (ref 65–99)
Potassium: 4.4 mmol/L (ref 3.5–5.2)
Sodium: 144 mmol/L (ref 134–144)
Total Protein: 6.9 g/dL (ref 6.0–8.5)

## 2018-11-02 NOTE — Telephone Encounter (Signed)
-----   Message from Kathrynn Ducking, MD sent at 11/02/2018  7:02 AM EDT -----  The blood work results are unremarkable. Please call the patient. ----- Message ----- From: Lavone Neri Lab Results In Sent: 11/02/2018   5:38 AM EDT To: Kathrynn Ducking, MD

## 2018-11-02 NOTE — Telephone Encounter (Signed)
I reached out to the patient and advised of results. She verbalized understanding and had no questions at this time.

## 2018-11-03 ENCOUNTER — Telehealth: Payer: Self-pay | Admitting: Neurology

## 2018-11-03 NOTE — Telephone Encounter (Signed)
no to the covid questions MR Brain w/wo contrast & MR Cervical spine w/wo contrast Dr. Stephanie Acre Auth: [id number] (exp. 11/02/18 to 04/30/19). Patient is scheduled at [facility] for 11/17/18.

## 2018-11-17 ENCOUNTER — Ambulatory Visit: Payer: BC Managed Care – PPO

## 2018-11-17 ENCOUNTER — Other Ambulatory Visit: Payer: Self-pay

## 2018-11-17 DIAGNOSIS — G3281 Cerebellar ataxia in diseases classified elsewhere: Secondary | ICD-10-CM

## 2018-11-17 MED ORDER — GADOBENATE DIMEGLUMINE 529 MG/ML IV SOLN
13.0000 mL | Freq: Once | INTRAVENOUS | Status: AC | PRN
  Administered 2018-11-17: 13 mL via INTRAVENOUS

## 2018-11-18 DIAGNOSIS — R2689 Other abnormalities of gait and mobility: Secondary | ICD-10-CM | POA: Diagnosis not present

## 2018-11-18 DIAGNOSIS — R293 Abnormal posture: Secondary | ICD-10-CM | POA: Diagnosis not present

## 2018-11-18 DIAGNOSIS — M6281 Muscle weakness (generalized): Secondary | ICD-10-CM | POA: Diagnosis not present

## 2018-11-20 ENCOUNTER — Telehealth: Payer: Self-pay | Admitting: Neurology

## 2018-11-20 NOTE — Telephone Encounter (Signed)
I called the patient.  The MRI of the brain and MRI of the cervical spine showed lesions that are most consistent with multiple sclerosis.  I will get an earlier revisit to discuss the diagnosis and to consider treatment options.   MRI brain 11/18/18:  IMPRESSION:   Abnormal MRI brain (with and without) demonstrating: - Multiple round and ovoid periventricular, subcortical, and left midbrain T2 hyperintensities. No abnormal lesions are seen on post contrast views. Considerations include chronic multiple sclerosis / demyelinating disease, autoimmune, inflammatory, or post-infectious etiologies.    MRI cervical 11/18/18:  IMPRESSION:   MRI cervical spine (with and without) demonstrating: - At C2-C3 there is a far left lateral intrinsic spinal cord T2 hyperintensity consistent with chronic demyelinating plaque. - No acute findings.

## 2018-11-22 NOTE — Telephone Encounter (Signed)
Holding for slot.

## 2018-11-24 DIAGNOSIS — R2689 Other abnormalities of gait and mobility: Secondary | ICD-10-CM | POA: Diagnosis not present

## 2018-11-24 DIAGNOSIS — M6281 Muscle weakness (generalized): Secondary | ICD-10-CM | POA: Diagnosis not present

## 2018-11-24 DIAGNOSIS — R293 Abnormal posture: Secondary | ICD-10-CM | POA: Diagnosis not present

## 2018-11-24 NOTE — Telephone Encounter (Signed)
I reached out to the pt and scheduled appt for 12/07/2018 at 900 check in time of 830.

## 2018-12-02 DIAGNOSIS — R293 Abnormal posture: Secondary | ICD-10-CM | POA: Diagnosis not present

## 2018-12-02 DIAGNOSIS — R2689 Other abnormalities of gait and mobility: Secondary | ICD-10-CM | POA: Diagnosis not present

## 2018-12-02 DIAGNOSIS — M6281 Muscle weakness (generalized): Secondary | ICD-10-CM | POA: Diagnosis not present

## 2018-12-07 ENCOUNTER — Encounter: Payer: Self-pay | Admitting: Neurology

## 2018-12-07 ENCOUNTER — Telehealth: Payer: Self-pay

## 2018-12-07 ENCOUNTER — Ambulatory Visit: Payer: BC Managed Care – PPO | Admitting: Neurology

## 2018-12-07 ENCOUNTER — Other Ambulatory Visit: Payer: Self-pay

## 2018-12-07 VITALS — BP 122/74 | HR 83 | Temp 97.5°F | Wt 141.0 lb

## 2018-12-07 DIAGNOSIS — Z5181 Encounter for therapeutic drug level monitoring: Secondary | ICD-10-CM

## 2018-12-07 DIAGNOSIS — G35 Multiple sclerosis: Secondary | ICD-10-CM

## 2018-12-07 HISTORY — DX: Multiple sclerosis: G35

## 2018-12-07 MED ORDER — BACLOFEN 10 MG PO TABS: 5.0000 mg | ORAL_TABLET | Freq: Two times a day (BID) | ORAL | 1 refills | Status: DC

## 2018-12-07 NOTE — Telephone Encounter (Signed)
JCV virus lab work form done with pts information, demographic sheet together with lab. Put in [facility] box.

## 2018-12-07 NOTE — Progress Notes (Signed)
Reason for visit: Multiple sclerosis  Heather [name] is an 63 y.o. female  History of present illness:  Heather [name] is a 63 year old right-handed white female with a history of a progressive left hemiparesis with a gait disorder.  She has no real sensory complaints and no vision problems.  MRI of the brain and cervical spine had shown evidence of multiple sclerosis.  The patient returns for an evaluation.  She has engaged in physical therapy, she believes that this is helpful for her.  She has not had any falls since last seen.  The patient returns for evaluation for discussion of potential medical therapies for her multiple sclerosis.  Past Medical History:  Diagnosis Date  . Left spastic hemiparesis (HCC) 11/01/2018    Past Surgical History:  Procedure Laterality Date  . FINGER SURGERY    . GALLBLADDER SURGERY    . TUBAL LIGATION      History reviewed. No pertinent family history.  Social history:  reports that she has never smoked. She has never used smokeless tobacco. She reports that she does not drink alcohol or use drugs.    Allergies  Allergen Reactions  . Erythromycin   . Penicillins     Medications:  Prior to Admission medications   Medication Sig Start Date End Date Taking? Authorizing Provider  Ibuprofen (ADVIL PO) Take by mouth as needed.    Yes [provider]  Multiple Vitamin (MULTIVITAMIN) capsule Take 1 capsule by mouth daily.   Yes [provider]    ROS:  Out of a complete 14 system review of symptoms, the patient complains only of the following symptoms, and all other reviewed systems are negative.  Walking difficulty, left-sided weakness  Blood pressure 122/74, pulse 83, temperature (!) 97.5 F (36.4 C), weight 141 lb (64 kg).  Physical Exam  General: The patient is alert and cooperative at the time of the examination.  Skin: No significant peripheral edema is noted.   Neurologic Exam  Mental status: The patient is alert  and oriented x 3 at the time of the examination. The patient has apparent normal recent and remote memory, with an apparently normal attention span and concentration ability.   Cranial nerves: Facial symmetry is present. Speech is normal, no aphasia or dysarthria is noted. Extraocular movements are full. Visual fields are full.  Motor: The patient has good strength in all 4 extremities, with exception of a moderate foot drop on the left and weakness of intrinsic muscles on the left hand.  Sensory examination: Soft touch sensation is symmetric on the face, arms, and legs.  Coordination: The patient has good finger-nose-finger and heel-to-shin bilaterally.  Gait and station: The patient has a circumduction gait with the left leg, she is able to perform tandem gait.  Romberg is negative.  Reflexes: Deep tendon reflexes are symmetric in the arms, slightly increased on the left knee jerk reflex as compared to the right.     Marland Kitchen   MRI brain 11/18/18:  IMPRESSION:   Abnormal MRI brain (with and without) demonstrating: - Multiple round and ovoid periventricular, subcortical, and left midbrain T2 hyperintensities. No abnormal lesions are seen on post contrast views. Considerations include chronic multiple sclerosis / demyelinating disease, autoimmune, inflammatory, or post-infectious etiologies.    MRI cervical 11/18/18:  IMPRESSION:   MRI cervical spine (with and without) demonstrating: - At C2-C3 there is a far left lateral intrinsic spinal cord T2 hyperintensity consistent with chronic demyelinating plaque. - No acute findings.  *  MRI scan images were reviewed online. I agree with the written report.         Assessment/Plan:  1.  Multiple sclerosis with progressive features  The patient is having progressive changes with left-sided weakness.  We have discussed potential treatment with Ocrevus or Mayzent.  We will have blood work done today, she will call me in the  next several days to make a decision on what therapy to initiate.  The patient will follow-up here for her next scheduled visit in February 2021.  A prescription for low-dose baclofen will be sent in.  Blood work will be done today.  Greater than 50% of the visit was spent in counseling and coordination of care.  Face-to-face time with the patient was 20 minutes.   Heather Alexanders MD 12/07/2018 9:11 AM  [facility] [address] [place], [place] [address]  Phone [phone] Fax [phone]

## 2018-12-10 LAB — CBC WITH DIFFERENTIAL/PLATELET
Basophils Absolute: 0.1 10*3/uL (ref 0.0–0.2)
Basos: 2 %
EOS (ABSOLUTE): 0.1 10*3/uL (ref 0.0–0.4)
Eos: 2 %
Hematocrit: 41.7 % (ref 34.0–46.6)
Hemoglobin: 13.6 g/dL (ref 11.1–15.9)
Immature Grans (Abs): 0 10*3/uL (ref 0.0–0.1)
Immature Granulocytes: 0 %
Lymphocytes Absolute: 1.2 10*3/uL (ref 0.7–3.1)
Lymphs: 23 %
MCH: 31.3 pg (ref 26.6–33.0)
MCHC: 32.6 g/dL (ref 31.5–35.7)
MCV: 96 fL (ref 79–97)
Monocytes Absolute: 0.5 10*3/uL (ref 0.1–0.9)
Monocytes: 11 %
Neutrophils Absolute: 3.1 10*3/uL (ref 1.4–7.0)
Neutrophils: 62 %
Platelets: 253 10*3/uL (ref 150–450)
RBC: 4.35 x10E6/uL (ref 3.77–5.28)
RDW: 11.7 % (ref 11.7–15.4)
WBC: 5 10*3/uL (ref 3.4–10.8)

## 2018-12-10 LAB — HEPATITIS B SURFACE ANTIBODY,QUALITATIVE: Hep B Surface Ab, Qual: NONREACTIVE

## 2018-12-10 LAB — QUANTIFERON-TB GOLD PLUS
QuantiFERON Mitogen Value: 8.56 IU/mL
QuantiFERON Nil Value: 0.01 IU/mL
QuantiFERON TB1 Ag Value: 0.02 IU/mL
QuantiFERON TB2 Ag Value: 0.02 IU/mL
QuantiFERON-TB Gold Plus: NEGATIVE

## 2018-12-10 LAB — HEPATITIS B CORE ANTIBODY, TOTAL: Hep B Core Total Ab: NEGATIVE

## 2018-12-10 LAB — VARICELLA ZOSTER ANTIBODY, IGG: Varicella zoster IgG: >4000 index (ref >165)

## 2018-12-10 LAB — HEPATITIS C ANTIBODY: Hep C Virus Ab: <0.1 s/co ratio (ref 0.0–0.9)

## 2018-12-11 ENCOUNTER — Telehealth: Payer: Self-pay | Admitting: Neurology

## 2018-12-11 NOTE — Telephone Encounter (Signed)
I called the patient.  The blood work is unremarkable, she is still not decided about medical therapy or not, she is not sure whether she wants to start [place] or Ocrevus.  She will let me know when she does make a decision.

## 2018-12-13 DIAGNOSIS — M6281 Muscle weakness (generalized): Secondary | ICD-10-CM | POA: Diagnosis not present

## 2018-12-13 DIAGNOSIS — R293 Abnormal posture: Secondary | ICD-10-CM | POA: Diagnosis not present

## 2018-12-13 DIAGNOSIS — R2689 Other abnormalities of gait and mobility: Secondary | ICD-10-CM | POA: Diagnosis not present

## 2018-12-16 ENCOUNTER — Telehealth: Payer: Self-pay | Admitting: *Deleted

## 2018-12-16 NOTE — Telephone Encounter (Signed)
Received [facility] lab result, 0.71 positive. Placed on Dr Tobey Grim desk for review.

## 2018-12-18 NOTE — Telephone Encounter (Signed)
The patient has a progressive form of multiple sclerosis, we will likely be using Ocrevus or Mayzent, not Tysabri.

## 2018-12-23 DIAGNOSIS — R2689 Other abnormalities of gait and mobility: Secondary | ICD-10-CM | POA: Diagnosis not present

## 2018-12-23 DIAGNOSIS — R293 Abnormal posture: Secondary | ICD-10-CM | POA: Diagnosis not present

## 2018-12-23 DIAGNOSIS — M6281 Muscle weakness (generalized): Secondary | ICD-10-CM | POA: Diagnosis not present

## 2019-01-03 DIAGNOSIS — M6281 Muscle weakness (generalized): Secondary | ICD-10-CM | POA: Diagnosis not present

## 2019-01-03 DIAGNOSIS — R293 Abnormal posture: Secondary | ICD-10-CM | POA: Diagnosis not present

## 2019-01-03 DIAGNOSIS — R2689 Other abnormalities of gait and mobility: Secondary | ICD-10-CM | POA: Diagnosis not present

## 2019-01-11 DIAGNOSIS — M6281 Muscle weakness (generalized): Secondary | ICD-10-CM | POA: Diagnosis not present

## 2019-01-11 DIAGNOSIS — R2689 Other abnormalities of gait and mobility: Secondary | ICD-10-CM | POA: Diagnosis not present

## 2019-01-11 DIAGNOSIS — R293 Abnormal posture: Secondary | ICD-10-CM | POA: Diagnosis not present

## 2019-02-02 DIAGNOSIS — R293 Abnormal posture: Secondary | ICD-10-CM | POA: Diagnosis not present

## 2019-02-02 DIAGNOSIS — R2689 Other abnormalities of gait and mobility: Secondary | ICD-10-CM | POA: Diagnosis not present

## 2019-02-02 DIAGNOSIS — M6281 Muscle weakness (generalized): Secondary | ICD-10-CM | POA: Diagnosis not present

## 2019-03-03 ENCOUNTER — Ambulatory Visit: Payer: BC Managed Care – PPO | Admitting: Neurology

## 2019-06-16 DIAGNOSIS — Z6822 Body mass index (BMI) 22.0-22.9, adult: Secondary | ICD-10-CM | POA: Diagnosis not present

## 2019-06-16 DIAGNOSIS — T63301A Toxic effect of unspecified spider venom, accidental (unintentional), initial encounter: Secondary | ICD-10-CM | POA: Diagnosis not present

## 2019-06-16 DIAGNOSIS — G35 Multiple sclerosis: Secondary | ICD-10-CM | POA: Diagnosis not present

## 2019-06-23 ENCOUNTER — Ambulatory Visit: Payer: PPO | Admitting: Neurology

## 2019-06-23 ENCOUNTER — Encounter: Payer: Self-pay | Admitting: Neurology

## 2019-06-23 VITALS — BP 118/72 | HR 77 | Ht 66.0 in | Wt 144.5 lb

## 2019-06-23 DIAGNOSIS — G35 Multiple sclerosis: Secondary | ICD-10-CM | POA: Diagnosis not present

## 2019-06-23 DIAGNOSIS — M21372 Foot drop, left foot: Secondary | ICD-10-CM

## 2019-06-23 DIAGNOSIS — G8114 Spastic hemiplegia affecting left nondominant side: Secondary | ICD-10-CM | POA: Diagnosis not present

## 2019-06-23 MED ORDER — BACLOFEN 10 MG PO TABS: 5.0000 mg | ORAL_TABLET | Freq: Two times a day (BID) | ORAL | 1 refills | Status: DC

## 2019-06-23 NOTE — Patient Instructions (Signed)
Try baclofen 10 mg tablet, 1/2 tablet twice a day for the leg spasticity.

## 2019-06-23 NOTE — Progress Notes (Signed)
Reason for visit: Multiple sclerosis  Heather [name] is an 64 y.o. female  History of present illness:  Heather [name] is a 64 year old right-handed white female with a history of multiple sclerosis associated with a secondary progressive course.  The patient has had progressive weakness involving the left side of the body.  The patient has a circumduction gait with the left leg, she has not had any falls but she does fatigue some when she walks longer distances as walking takes a lot of energy.  Heat exposure worsens her symptoms.  She denies any significant issues controlling the bowels or the bladder, she does have a "dropped bladder".  The patient has a good energy level.  She denies any vision changes.  She has not had any significant cognitive alterations.  She tries to stay active throughout the day.  When last seen, we discussed the possibility of going on Ocrevus or Mayzent for her disease.  She returns for further evaluation.  Past Medical History:  Diagnosis Date  . Left spastic hemiparesis ([place]) 11/01/2018  . Multiple sclerosis ([place]) 12/07/2018    Past Surgical History:  Procedure Laterality Date  . FINGER SURGERY    . GALLBLADDER SURGERY    . TUBAL LIGATION      History reviewed. No pertinent family history.  Social history:  reports that she has never smoked. She has never used smokeless tobacco. She reports that she does not drink alcohol and does not use drugs.    Allergies  Allergen Reactions  . Erythromycin   . Penicillins     Medications:  Prior to Admission medications   Medication Sig Start Date End Date Taking? Authorizing Provider  Cholecalciferol (VITAMIN D) 50 MCG (2000 UT) tablet Take 2,000 Units by mouth daily.   Yes [provider]  Ibuprofen (ADVIL PO) Take by mouth as needed.    Yes [provider]  Multiple Vitamin (MULTIVITAMIN) capsule Take 1 capsule by mouth daily.   Yes [provider]  baclofen (LIORESAL) 10 MG tablet  Take 0.5 tablets (5 mg total) by mouth 2 (two) times daily. 06/23/19   Kathrynn Ducking, MD    ROS:  Out of a complete 14 system review of symptoms, the patient complains only of the following symptoms, and all other reviewed systems are negative.  Walking difficulty Left-sided weakness  Blood pressure 118/72, pulse 77, height 5\' 6"  (1.676 m), weight 144 lb 8 oz (65.5 kg).  Physical Exam  General: The patient is alert and cooperative at the time of the examination.  Skin: No significant peripheral edema is noted.   Neurologic Exam  Mental status: The patient is alert and oriented x 3 at the time of the examination. The patient has apparent normal recent and remote memory, with an apparently normal attention span and concentration ability.   Cranial nerves: Facial symmetry is present. Speech is normal, no aphasia or dysarthria is noted. Extraocular movements are full. Visual fields are full.  Pupils are equal, round, and reactive to light.  Discs are flat bilaterally.  Motor: The patient has good strength in all 4 extremities, with exception of 4 -/5 strength hip flexion on the left, slight weakness with knee extension on the left.   Sensory examination: Soft touch sensation is symmetric on the face, arms, and legs.  Coordination: The patient has good finger-nose-finger and heel-to-shin on the right, the patient has some slight dysmetria with finger-nose-finger with the left hand and has some difficulty performing heel-to-shin with  the left leg.  Gait and station: The patient has a prominent circumduction gait with the left leg.  The patient is able to walk independently.  Tandem gait is minimally unsteady.  Romberg is negative.  No drift is seen.  The patient is able to walk on the heels and the toes bilaterally.  Reflexes: Deep tendon reflexes are notable for slight increase in reflexes with the left biceps as compared to the right, more symmetric in the  legs.   Assessment/Plan:  1.  Secondary progressive multiple sclerosis  2.  Gait disorder  The patient will be given another trial on the baclofen, she never took the medication previously.  A prescription was given for an AFO brace, this may help her ability to ambulate.  She will be referred to a physical therapist at [facility] to evaluate her for the AFO brace.  The patient will be initiated on Ocrevus therapy, she will follow-up here in 6 months.  She has gotten her Covid virus vaccination.  Marlan Palau MD 06/23/2019 8:31 AM  [facility] [address] [place], [place] [address]  Phone [phone] Fax [phone]

## 2019-06-28 ENCOUNTER — Telehealth: Payer: Self-pay

## 2019-06-28 NOTE — Telephone Encounter (Signed)
I called pt to verify if her insurance was still health team advantage. Pt stated [employer] was her old insurance. I stated her medicare and health team advantage card will be attached to her start form for processing. She verbalized understanding.

## 2019-06-28 NOTE — Telephone Encounter (Signed)
I fax ocrevus start form, last office notes, insurance card, demographic sheet,and medication list to [phone] twice and confirmed.

## 2019-07-06 NOTE — Telephone Encounter (Signed)
I called Ocrevus start form at  [phone]. I stated the form was fax on 06/28/2019 but our office has not receive a fax that its being process. They stated a letter was fax to our office last week but it failed to be deliver. She stated the medication will not require a PA and pt will have a out of pocket expense and the rep will be calling her this week to discuss the out of pocket price.

## 2019-07-14 NOTE — Telephone Encounter (Signed)
Denyse Amass with [employer] requesting a call back to verify info on the Ocrevus start form that was sent in. Please call back at [phone] opt 4.

## 2019-07-19 NOTE — Telephone Encounter (Signed)
Receive fax from [employer] that pts ocrevus was approve from 07/15/2019 to 10/13/2019. Contact number is [phone]. Form given to nurse at [facility].

## 2019-07-19 NOTE — Telephone Encounter (Signed)
I called [employer], spoke to [place]. I verified pt's demos. They will be completing a benefits investigation and faxing [place] a determination for free drug when this is completed.

## 2019-07-20 NOTE — Telephone Encounter (Signed)
Receive fax from [employer] that pt ocrevus was approve to receive free of charge.Copy given to liane in [facility].

## 2019-07-21 NOTE — Telephone Encounter (Signed)
I spoke to [place], Charity fundraiser in [facility]. [facility] will be calling her to schedule her infusion. Will continue to follow.

## 2019-08-03 DIAGNOSIS — G35 Multiple sclerosis: Secondary | ICD-10-CM | POA: Diagnosis not present

## 2019-08-03 DIAGNOSIS — M8580 Other specified disorders of bone density and structure, unspecified site: Secondary | ICD-10-CM | POA: Diagnosis not present

## 2019-08-03 DIAGNOSIS — Z0000 Encounter for general adult medical examination without abnormal findings: Secondary | ICD-10-CM | POA: Diagnosis not present

## 2019-08-03 DIAGNOSIS — R739 Hyperglycemia, unspecified: Secondary | ICD-10-CM | POA: Diagnosis not present

## 2019-08-03 DIAGNOSIS — Z6822 Body mass index (BMI) 22.0-22.9, adult: Secondary | ICD-10-CM | POA: Diagnosis not present

## 2019-08-03 DIAGNOSIS — E785 Hyperlipidemia, unspecified: Secondary | ICD-10-CM | POA: Diagnosis not present

## 2019-08-03 NOTE — Telephone Encounter (Signed)
Pt is scheduled for her first 300mg  ocrevus infusion on 08/09/2019.

## 2019-08-09 DIAGNOSIS — G35 Multiple sclerosis: Secondary | ICD-10-CM | POA: Diagnosis not present

## 2019-08-24 DIAGNOSIS — G35 Multiple sclerosis: Secondary | ICD-10-CM | POA: Diagnosis not present

## 2019-12-27 ENCOUNTER — Encounter: Payer: Self-pay | Admitting: Neurology

## 2019-12-27 ENCOUNTER — Ambulatory Visit: Payer: PPO | Admitting: Neurology

## 2019-12-27 VITALS — BP 118/80 | Ht 66.0 in | Wt 145.6 lb

## 2019-12-27 DIAGNOSIS — G35 Multiple sclerosis: Secondary | ICD-10-CM

## 2019-12-27 NOTE — Patient Instructions (Signed)
Check blood work today  Try Baclofen for muscle stiffness Continue Ocrevus  See you back in 6 months

## 2019-12-27 NOTE — Progress Notes (Signed)
I have read the note, and I agree with the clinical assessment and plan.  Kayra Crowell K Kenetra Hildenbrand   

## 2019-12-27 NOTE — Progress Notes (Signed)
PATIENT: Heather [name] DOB: [birth year]  REASON FOR VISIT: follow up HISTORY FROM: patient  HISTORY OF PRESENT ILLNESS: Today 12/27/19 Heather [name] is 64 year old female with history of secondary progressive multiple sclerosis, just diagnosed in November 2020.  Has progressive weakness of the left side.  Circumduction type gait with the left leg.  She received her first Ocrevus infusion 08/09/2019, second was 08/24/2019.  Has done well with Ocrevus, has noted improvement in balance.  She did complete PT, 8-12 weeks with good benefit, still does exercises at home.  Left AFO was ordered, but has not received it.  Had 1 fall in September, she tripped.  MS is overall stable, no changes to the vision, B/B.  Feels overall, the left side is weaker, for years.  Has yet to try the baclofen.  She remains active, cares for her elderly parents, her father has dementia.  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  Since starting Ocrevus, feels more confident with her walking.  Presents today for evaluation accompanied by her daughter. Next [name] is Mar 07, 2020.  HISTORY  06/23/2019 Dr. Anne Hahn: [name] is a 64 year old right-handed white female with a history of multiple sclerosis associated with a secondary progressive course.  The patient has had progressive weakness involving the left side of the body.  The patient has a circumduction gait with the left leg, she has not had any falls but she does fatigue some when she walks longer distances as walking takes a lot of energy.  Heat exposure worsens her symptoms.  She denies any significant issues controlling the bowels or the bladder, she does have a "dropped bladder".  The patient has a good energy level.  She denies any vision changes.  She has not had any significant cognitive alterations.  She tries to stay active throughout the day.  When last seen, we discussed the possibility of going on Ocrevus or Mayzent for her disease.  She returns for further  evaluation.  REVIEW OF SYSTEMS: Out of a complete 14 system review of symptoms, the patient complains only of the following symptoms, and all other reviewed systems are negative.  Walking difficulty, left leg weakness  ALLERGIES: Allergies  Allergen Reactions   Erythromycin    Penicillins     HOME MEDICATIONS: Outpatient Medications Prior to Visit  Medication Sig Dispense Refill   baclofen (LIORESAL) 10 MG tablet Take 0.5 tablets (5 mg total) by mouth 2 (two) times daily. 30 each 1   Cholecalciferol (VITAMIN D) 50 MCG (2000 UT) tablet Take 2,000 Units by mouth daily.     Ibuprofen (ADVIL PO) Take by mouth as needed.      Multiple Vitamin (MULTIVITAMIN) capsule Take 1 capsule by mouth daily.     ocrelizumab (OCREVUS) 300 MG/10ML injection Inject 600 mg into the vein. 300mg IV on day 1 &15. 600mg  IV q 6 months thereafter. [facility].     No facility-administered medications prior to visit.    PAST MEDICAL HISTORY: Past Medical History:  Diagnosis Date   Left spastic hemiparesis (HCC) 11/01/2018   Multiple sclerosis (HCC) 12/07/2018    PAST SURGICAL HISTORY: Past Surgical History:  Procedure Laterality Date   FINGER SURGERY     GALLBLADDER SURGERY     TUBAL LIGATION      FAMILY HISTORY: No family history on file.  SOCIAL HISTORY: Social History   Socioeconomic History   Marital status: Married    Spouse name: Not on file   Number of children: Not  on file   Years of education: Not on file   Highest education level: Not on file  Occupational History   Not on file  Tobacco Use   Smoking status: Never Smoker   Smokeless tobacco: Never Used  Substance and Sexual Activity   Alcohol use: Never    Alcohol/week: 0.0 standard drinks   Drug use: Never   Sexual activity: Not on file  Other Topics Concern   Not on file  Social History Narrative   Coffee 2-3 cups per day    Lives at home with her husband    Social Determinants of Health    Financial Resource Strain: Not on file  Food Insecurity: Not on file  Transportation Needs: Not on file  Physical Activity: Not on file  Stress: Not on file  Social Connections: Not on file  Intimate Partner Violence: Not on file   PHYSICAL EXAM  Vitals:   12/27/19 0841  BP: 118/80  Weight: 145 lb 9.6 oz (66 kg)  Height: 5\' 6"  (1.676 m)   Body mass index is 23.5 kg/m.  Generalized: Well developed, in no acute distress   Neurological examination  Mentation: Alert oriented to time, place, history taking. Follows all commands speech and language fluent Cranial nerve II-XII: Pupils were equal round reactive to light. Extraocular movements were full, visual field were full on confrontational test. Facial sensation and strength were normal. Head turning and shoulder shrug  were normal and symmetric. Motor: Good strength in all extremities, exception 4/5 left hip flexion Sensory: Sensory testing is intact to soft touch on all 4 extremities. No evidence of extinction is noted.  Coordination: Cerebellar testing reveals good finger-nose-finger and heel-to-shin bilaterally, but mild difficulty lifting the left leg. Gait and station: Circumduction type gait with the left leg, can walk independently, no assistive device Reflexes: Deep tendon reflexes are symmetric but slightly increased in the knees  DIAGNOSTIC DATA (LABS, IMAGING, TESTING) - I reviewed patient records, labs, notes, testing and imaging myself where available.  Lab Results  Component Value Date   WBC 5.0 12/07/2018   HGB 13.6 12/07/2018   HCT 41.7 12/07/2018   MCV 96 12/07/2018   PLT 253 12/07/2018      Component Value Date/Time   NA 144 11/01/2018 1010   K 4.4 11/01/2018 1010   CL 106 11/01/2018 1010   CO2 24 11/01/2018 1010   GLUCOSE 100 (H) 11/01/2018 1010   BUN 9 11/01/2018 1010   CREATININE 0.68 11/01/2018 1010   CALCIUM 9.6 11/01/2018 1010   PROT 6.9 11/01/2018 1010   ALBUMIN 4.5 11/01/2018 1010   AST  14 11/01/2018 1010   ALT 13 11/01/2018 1010   ALKPHOS 65 11/01/2018 1010   BILITOT 0.4 11/01/2018 1010   GFRNONAA 93 11/01/2018 1010   GFRAA 107 11/01/2018 1010   No results found for: CHOL, HDL, LDLCALC, LDLDIRECT, TRIG, CHOLHDL No results found for: 11/03/2018 No results found for: VITAMINB12 No results found for: TSH  ASSESSMENT AND PLAN 64 y.o. year old female  has a past medical history of Left spastic hemiparesis (HCC) (11/01/2018) and Multiple sclerosis (HCC) (12/07/2018). here with:  1.  Secondary progressive MS 2.  Gait disorder  She did well with her 1st dose of Ocrevus, next is scheduled for February 2022.  We will continue Ocrevus, check routine labs today.  Encouraged her to try baclofen for spasticity.  We talked about repeating MRI of the brain and cervical spine, initial studies were November 2020 at the time  of diagnosis.  She wishes to hold off until next visit, MS seems to have remained stable, improvement in balance.  I told her it is okay for her to receive the flu and Covid booster about 2 weeks apart, 4-6 weeks before her next Ocrevus infusion.  Follow-up in 6 months or sooner if needed.  I spent 30 minutes of face-to-face and non-face-to-face time with patient.  This included previsit chart review, lab review, study review, order entry, electronic health record documentation, patient education.  Margie Ege, AGNP-C, DNP 12/27/2019, 9:22 AM [facility] [address] [place], [place] [address] [phone]

## 2019-12-28 LAB — CBC WITH DIFFERENTIAL/PLATELET
Basophils Absolute: 0.1 10*3/uL (ref 0.0–0.2)
Basos: 1 %
EOS (ABSOLUTE): 0.1 10*3/uL (ref 0.0–0.4)
Eos: 2 %
Hematocrit: 39.6 % (ref 34.0–46.6)
Hemoglobin: 13.3 g/dL (ref 11.1–15.9)
Immature Grans (Abs): 0 10*3/uL (ref 0.0–0.1)
Immature Granulocytes: 0 %
Lymphocytes Absolute: 0.8 10*3/uL (ref 0.7–3.1)
Lymphs: 18 %
MCH: 31.4 pg (ref 26.6–33.0)
MCHC: 33.6 g/dL (ref 31.5–35.7)
MCV: 93 fL (ref 79–97)
Monocytes Absolute: 0.7 10*3/uL (ref 0.1–0.9)
Monocytes: 15 %
Neutrophils Absolute: 2.9 10*3/uL (ref 1.4–7.0)
Neutrophils: 64 %
Platelets: 247 10*3/uL (ref 150–450)
RBC: 4.24 x10E6/uL (ref 3.77–5.28)
RDW: 11.5 % — ABNORMAL LOW (ref 11.7–15.4)
WBC: 4.6 10*3/uL (ref 3.4–10.8)

## 2019-12-28 LAB — COMPREHENSIVE METABOLIC PANEL
ALT: 14 IU/L (ref 0–32)
AST: 11 IU/L (ref 0–40)
Albumin/Globulin Ratio: 2 (ref 1.2–2.2)
Albumin: 4.5 g/dL (ref 3.8–4.8)
Alkaline Phosphatase: 65 IU/L (ref 44–121)
BUN/Creatinine Ratio: 18 (ref 12–28)
BUN: 12 mg/dL (ref 8–27)
Bilirubin Total: 0.5 mg/dL (ref 0.0–1.2)
CO2: 24 mmol/L (ref 20–29)
Calcium: 9.7 mg/dL (ref 8.7–10.3)
Chloride: 107 mmol/L — ABNORMAL HIGH (ref 96–106)
Creatinine, Ser: 0.67 mg/dL (ref 0.57–1.00)
GFR calc Af Amer: 107 mL/min/{1.73_m2} (ref >59)
GFR calc non Af Amer: 93 mL/min/{1.73_m2} (ref >59)
Globulin, Total: 2.2 g/dL (ref 1.5–4.5)
Glucose: 92 mg/dL (ref 65–99)
Potassium: 4.5 mmol/L (ref 3.5–5.2)
Sodium: 145 mmol/L — ABNORMAL HIGH (ref 134–144)
Total Protein: 6.7 g/dL (ref 6.0–8.5)

## 2019-12-28 LAB — IGG, IGA, IGM
IgA/Immunoglobulin A, Serum: 155 mg/dL (ref 87–352)
IgG (Immunoglobin G), Serum: 969 mg/dL (ref 586–1602)
IgM (Immunoglobulin M), Srm: 47 mg/dL (ref 26–217)

## 2020-01-27 DIAGNOSIS — G35 Multiple sclerosis: Secondary | ICD-10-CM | POA: Diagnosis not present

## 2020-01-27 DIAGNOSIS — J329 Chronic sinusitis, unspecified: Secondary | ICD-10-CM | POA: Diagnosis not present

## 2020-01-27 DIAGNOSIS — Z20828 Contact with and (suspected) exposure to other viral communicable diseases: Secondary | ICD-10-CM | POA: Diagnosis not present

## 2020-01-27 DIAGNOSIS — J40 Bronchitis, not specified as acute or chronic: Secondary | ICD-10-CM | POA: Diagnosis not present

## 2020-02-08 DIAGNOSIS — M21372 Foot drop, left foot: Secondary | ICD-10-CM | POA: Diagnosis not present

## 2020-03-07 DIAGNOSIS — G35 Multiple sclerosis: Secondary | ICD-10-CM | POA: Diagnosis not present

## 2020-04-24 ENCOUNTER — Other Ambulatory Visit: Payer: Self-pay | Admitting: Emergency Medicine

## 2020-04-24 ENCOUNTER — Other Ambulatory Visit: Payer: Self-pay | Admitting: Neurology

## 2020-04-26 ENCOUNTER — Other Ambulatory Visit: Payer: Self-pay | Admitting: Neurology

## 2020-05-08 DIAGNOSIS — Z1231 Encounter for screening mammogram for malignant neoplasm of breast: Secondary | ICD-10-CM | POA: Diagnosis not present

## 2020-06-25 NOTE — Progress Notes (Signed)
PATIENT: Heather [name] DOB: August 27, 1955  REASON FOR VISIT: MS HISTORY FROM: Patient Primary Neurologist: Dr. Anne Hahn   HISTORY OF PRESENT ILLNESS: Today 06/26/20 Heather [name] is a 65 year old female with history of secondary progressive MS.  Just diagnosed in November 2020.  Has weakness on the left side.  Is on Ocrevus, last infusion was in February 2022, next is in September.  Denies any adverse effect.  No falls, but will lean side to side, has left foot drop. Doesn't feel her AFO fits well, causes more gait instability.  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  Hospice is involved.  Taking baclofen 5 mg at bedtime, does help with muscle tightness. Reports her bladder has dropped, unrelated to MS. Uses a cane when she goes out.  Had a mild case of COVID in January.  No other health issues.  Just drove her daughter to [place] yesterday for [employer] summer program. She doesn't let MS slow her down.  Here today for follow-up unaccompanied.  Update 12/27/2019 SS: Heather [name] is 65 year old female with history of secondary progressive multiple sclerosis, just diagnosed in November 2020.  Has progressive weakness of the left side.  Circumduction type gait with the left leg.  She received her first Ocrevus infusion 08/09/2019, second was 08/24/2019.  Has done well with Ocrevus, has noted improvement in balance.  She did complete PT, 8-12 weeks with good benefit, still does exercises at home.  Left AFO was ordered, but has not received it.  Had 1 fall in September, she tripped.  MS is overall stable, no changes to the vision, B/B.  Feels overall, the left side is weaker, for years.  Has yet to try the baclofen.  She remains active, cares for her elderly parents, her father has dementia.  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  Since starting Ocrevus, feels more confident with her walking.  Presents today for evaluation  accompanied by her daughter. Next [name] is Mar 07, 2020.  HISTORY  06/23/2019 Dr. Anne Hahn: Heather [name] is a 65 year old right-handed white female with a history of multiple sclerosis associated with a secondary progressive course.  The patient has had progressive weakness involving the left side of the body.  The patient has a circumduction gait with the left leg, she has not had any falls but she does fatigue some when she walks longer distances as walking takes a lot of energy.  Heat exposure worsens her symptoms.  She denies any significant issues controlling the bowels or the bladder, she does have a "dropped bladder".  The patient has a good energy level.  She denies any vision changes.  She has not had any significant cognitive alterations.  She tries to stay active throughout the day.  When last seen, we discussed the possibility of going on Ocrevus or Mayzent for her disease.  She returns for further evaluation.   REVIEW OF SYSTEMS: Out of a complete 14 system review of symptoms, the patient complains only of the following symptoms, and all other reviewed systems are negative.  See HPI  ALLERGIES: Allergies  Allergen Reactions   Erythromycin    Penicillins     HOME MEDICATIONS: Outpatient Medications Prior to Visit  Medication Sig Dispense Refill   Cholecalciferol (VITAMIN D) 50 MCG (2000 UT) tablet Take 2,000 Units by mouth daily.     Ibuprofen (ADVIL PO) Take by mouth as needed.  Multiple Vitamin (MULTIVITAMIN) capsule Take 1 capsule by mouth daily.     ocrelizumab (OCREVUS) 300 MG/10ML injection Inject 600 mg into the vein. 300mg IV on day 1 &15. 600mg  IV q 6 months thereafter. [facility].     baclofen (LIORESAL) 10 MG tablet TAKE 1/2 TABLET BY MOUTH TWICE A DAY. 30 tablet 1   No facility-administered medications prior to visit.    PAST MEDICAL HISTORY: Past Medical History:  Diagnosis Date   Left spastic hemiparesis (HCC) 11/01/2018   Multiple sclerosis (HCC)  12/07/2018    PAST SURGICAL HISTORY: Past Surgical History:  Procedure Laterality Date   FINGER SURGERY     GALLBLADDER SURGERY     TUBAL LIGATION      FAMILY HISTORY: History reviewed. No pertinent family history.  SOCIAL HISTORY: Social History   Socioeconomic History   Marital status: Married    Spouse name: Not on file   Number of children: Not on file   Years of education: Not on file   Highest education level: Not on file  Occupational History   Not on file  Tobacco Use   Smoking status: Never   Smokeless tobacco: Never  Substance and Sexual Activity   Alcohol use: Never    Alcohol/week: 0.0 standard drinks   Drug use: Never   Sexual activity: Not on file  Other Topics Concern   Not on file  Social History Narrative   Coffee 2-3 cups per day    Lives at home with her husband    Social Determinants of Health   Financial Resource Strain: Not on file  Food Insecurity: Not on file  Transportation Needs: Not on file  Physical Activity: Not on file  Stress: Not on file  Social Connections: Not on file  Intimate Partner Violence: Not on file   PHYSICAL EXAM  Vitals:   06/26/20 0843  BP: 110/67  Pulse: 72  Weight: 145 lb (65.8 kg)  Height: 5\' 6"  (1.676 m)    Body mass index is 23.4 kg/m.  Generalized: Well developed, in no acute distress  Neurological examination  Mentation: Alert oriented to time, place, history taking. Follows all commands speech and language fluent, very pleasant  Cranial nerve II-XII: Pupils were equal round reactive to light. Extraocular movements were full, visual field were full on confrontational test. Facial sensation and strength were normal. Head turning and shoulder shrug  were normal and symmetric. Motor: Good strength in all extremities, exception 4/5 left hip flexion; left foot drop  Sensory: Sensory testing is intact to soft touch on all 4 extremities. No evidence of extinction is noted.  Coordination: Cerebellar  testing reveals good finger-nose-finger and heel-to-shin bilaterally, but leg leg feels heavy Gait and station: Circumduction type gait with the left leg, can walk independently, uses cane in hallways Reflexes: Deep tendon reflexes are symmetric but slightly increased in the knees  DIAGNOSTIC DATA (LABS, IMAGING, TESTING) - I reviewed patient records, labs, notes, testing and imaging myself where available.  Lab Results  Component Value Date   WBC 4.6 12/27/2019   HGB 13.3 12/27/2019   HCT 39.6 12/27/2019   MCV 93 12/27/2019   PLT 247 12/27/2019      Component Value Date/Time   NA 145 (H) 12/27/2019 0926   K 4.5 12/27/2019 0926   CL 107 (H) 12/27/2019 0926   CO2 24 12/27/2019 0926   GLUCOSE 92 12/27/2019 0926   BUN 12 12/27/2019 0926   CREATININE 0.67 12/27/2019 0926   CALCIUM 9.7 12/27/2019  0926   PROT 6.7 12/27/2019 0926   ALBUMIN 4.5 12/27/2019 0926   AST 11 12/27/2019 0926   ALT 14 12/27/2019 0926   ALKPHOS 65 12/27/2019 0926   BILITOT 0.5 12/27/2019 0926   GFRNONAA 93 12/27/2019 0926   GFRAA 107 12/27/2019 0926   No results found for: CHOL, HDL, LDLCALC, LDLDIRECT, TRIG, CHOLHDL No results found for: GNOI3B No results found for: VITAMINB12 No results found for: TSH  ASSESSMENT AND PLAN 65 y.o. year old female  has a past medical history of Left spastic hemiparesis (HCC) (11/01/2018) and Multiple sclerosis (HCC) (12/07/2018). here with:  1.  Secondary progressive MS 2.  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  This included previsit chart review, lab review, study review, order entry, discussing medications, signs of MS exacerbation, MRI interval, follow-up.   Margie Ege, AGNP-C, DNP 06/26/2020, 9:18 AM [facility] [address], [address] [place], [place] [address] [phone]

## 2020-06-26 ENCOUNTER — Encounter: Payer: Self-pay | Admitting: Neurology

## 2020-06-26 ENCOUNTER — Ambulatory Visit: Payer: PPO | Admitting: Neurology

## 2020-06-26 VITALS — BP 110/67 | HR 72 | Ht 66.0 in | Wt 145.0 lb

## 2020-06-26 DIAGNOSIS — R29898 Other symptoms and signs involving the musculoskeletal system: Secondary | ICD-10-CM

## 2020-06-26 DIAGNOSIS — G35 Multiple sclerosis: Secondary | ICD-10-CM

## 2020-06-26 MED ORDER — BACLOFEN 10 MG PO TABS: 5.0000 mg | ORAL_TABLET | Freq: Two times a day (BID) | ORAL | 1 refills | Status: DC

## 2020-06-26 NOTE — Progress Notes (Signed)
I have read the note, and I agree with the clinical assessment and plan.  Imir Brumbach K Bowie Doiron   

## 2020-06-26 NOTE — Patient Instructions (Signed)
Continue Ocrevus Order for AFO  Check labs today  See you back in 6 months, do MRIs at that time

## 2020-06-27 LAB — COMPREHENSIVE METABOLIC PANEL
ALT: 13 IU/L (ref 0–32)
AST: 14 IU/L (ref 0–40)
Albumin/Globulin Ratio: 1.6 (ref 1.2–2.2)
Albumin: 4.2 g/dL (ref 3.8–4.8)
Alkaline Phosphatase: 63 IU/L (ref 44–121)
BUN/Creatinine Ratio: 21 (ref 12–28)
BUN: 15 mg/dL (ref 8–27)
Bilirubin Total: 0.5 mg/dL (ref 0.0–1.2)
CO2: 23 mmol/L (ref 20–29)
Calcium: 9.4 mg/dL (ref 8.7–10.3)
Chloride: 105 mmol/L (ref 96–106)
Creatinine, Ser: 0.72 mg/dL (ref 0.57–1.00)
Globulin, Total: 2.6 g/dL (ref 1.5–4.5)
Glucose: 84 mg/dL (ref 65–99)
Potassium: 4 mmol/L (ref 3.5–5.2)
Sodium: 143 mmol/L (ref 134–144)
Total Protein: 6.8 g/dL (ref 6.0–8.5)
eGFR: 92 mL/min/{1.73_m2} (ref >59)

## 2020-06-27 LAB — CBC WITH DIFFERENTIAL/PLATELET
Basophils Absolute: 0.1 10*3/uL (ref 0.0–0.2)
Basos: 1 %
EOS (ABSOLUTE): 0.1 10*3/uL (ref 0.0–0.4)
Eos: 2 %
Hematocrit: 38 % (ref 34.0–46.6)
Hemoglobin: 12.7 g/dL (ref 11.1–15.9)
Immature Grans (Abs): 0 10*3/uL (ref 0.0–0.1)
Immature Granulocytes: 0 %
Lymphocytes Absolute: 0.7 10*3/uL (ref 0.7–3.1)
Lymphs: 12 %
MCH: 31.6 pg (ref 26.6–33.0)
MCHC: 33.4 g/dL (ref 31.5–35.7)
MCV: 95 fL (ref 79–97)
Monocytes Absolute: 0.8 10*3/uL (ref 0.1–0.9)
Monocytes: 13 %
Neutrophils Absolute: 4.3 10*3/uL (ref 1.4–7.0)
Neutrophils: 72 %
Platelets: 245 10*3/uL (ref 150–450)
RBC: 4.02 x10E6/uL (ref 3.77–5.28)
RDW: 11.4 % — ABNORMAL LOW (ref 11.7–15.4)
WBC: 5.9 10*3/uL (ref 3.4–10.8)

## 2020-06-27 LAB — IGG, IGA, IGM
IgA/Immunoglobulin A, Serum: 163 mg/dL (ref 87–352)
IgG (Immunoglobin G), Serum: 1010 mg/dL (ref 586–1602)
IgM (Immunoglobulin M), Srm: 48 mg/dL (ref 26–217)

## 2020-08-29 DIAGNOSIS — R69 Illness, unspecified: Secondary | ICD-10-CM | POA: Diagnosis not present

## 2020-09-03 ENCOUNTER — Telehealth: Payer: Self-pay | Admitting: *Deleted

## 2020-09-03 NOTE — Telephone Encounter (Signed)
Received fax from [employer]: patient is approved for Ocrevus free drug from date of letter- 07/19/2019. Copy given to Selena Batten, R, infusion suite. Pt ID# [id number]

## 2020-09-04 NOTE — Telephone Encounter (Signed)
She is scheduled for infusion at [facility] on 58/30/22.

## 2020-09-11 DIAGNOSIS — G35 Multiple sclerosis: Secondary | ICD-10-CM | POA: Diagnosis not present

## 2020-10-03 DIAGNOSIS — L739 Follicular disorder, unspecified: Secondary | ICD-10-CM | POA: Diagnosis not present

## 2020-10-03 DIAGNOSIS — Z0000 Encounter for general adult medical examination without abnormal findings: Secondary | ICD-10-CM | POA: Diagnosis not present

## 2020-10-03 DIAGNOSIS — M8580 Other specified disorders of bone density and structure, unspecified site: Secondary | ICD-10-CM | POA: Diagnosis not present

## 2020-10-03 DIAGNOSIS — Z1339 Encounter for screening examination for other mental health and behavioral disorders: Secondary | ICD-10-CM | POA: Diagnosis not present

## 2020-10-03 DIAGNOSIS — E785 Hyperlipidemia, unspecified: Secondary | ICD-10-CM | POA: Diagnosis not present

## 2020-10-03 DIAGNOSIS — Z6822 Body mass index (BMI) 22.0-22.9, adult: Secondary | ICD-10-CM | POA: Diagnosis not present

## 2020-10-03 DIAGNOSIS — Z2821 Immunization not carried out because of patient refusal: Secondary | ICD-10-CM | POA: Diagnosis not present

## 2020-10-03 DIAGNOSIS — Z9181 History of falling: Secondary | ICD-10-CM | POA: Diagnosis not present

## 2020-10-03 DIAGNOSIS — G35 Multiple sclerosis: Secondary | ICD-10-CM | POA: Diagnosis not present

## 2020-12-10 DIAGNOSIS — Z23 Encounter for immunization: Secondary | ICD-10-CM | POA: Diagnosis not present

## 2020-12-17 ENCOUNTER — Telehealth: Payer: Self-pay

## 2020-12-17 NOTE — Telephone Encounter (Signed)
Dr. Willis has retired. Dr. Penumalli will be the attending provider at [facility]. 

## 2020-12-31 NOTE — Progress Notes (Signed)
PATIENT: Heather [name] DOB: 03/13/1955  REASON FOR VISIT: MS HISTORY FROM: Patient Primary Neurologist: Dr. Anne Hahn, will now be followed by Dr. Marjory Lies  HISTORY OF PRESENT ILLNESS: Today 01/01/21 [name] is here today for follow-up. Remains on Ocrevus, last infusion was end of August 2022. Chronic left sided weakness, gait instability. Got a new left AFO brace, has helped. No recent falls. Uses cane when she goes out. Likes Ocrevus, does note some wearing off few weeks before infusion due. Taking baclofen 5 mg twice daily to help with spasticity. Her father passed away last week. She helps care for her mother and mother in law. She continues to be quite active, doesn't let this slow her down. No new MS symptoms. Does have a dropped bladder, considering seeing GYN/urology. Here with daughter.  History: Update 06/26/2020 SS: Heather [name] is a 65 year old female with history of secondary progressive MS.  Just diagnosed in November 2020.  Has weakness on the left side.  Is on Ocrevus, last infusion was in February 2022, next is in September.  Denies any adverse effect.  No falls, but will lean side to side, has left foot drop. Doesn't feel her AFO fits well, causes more gait instability.  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  Hospice is involved.  Taking baclofen 5 mg at bedtime, does help with muscle tightness. Reports her bladder has dropped, unrelated to MS. Uses a cane when she goes out.  Had a mild case of COVID in January.  No other health issues.  Just drove her daughter to [place] yesterday for [employer] summer program. She doesn't let MS slow her down.  Here today for follow-up unaccompanied.   REVIEW OF SYSTEMS: Out of a complete 14 system review of symptoms, the patient complains only of the following symptoms, and all other reviewed systems are negative.  See HPI  ALLERGIES: Allergies  Allergen Reactions    Erythromycin    Penicillins     HOME MEDICATIONS: Outpatient Medications Prior to Visit  Medication Sig Dispense Refill   Cholecalciferol (VITAMIN D) 50 MCG (2000 UT) tablet Take 2,000 Units by mouth daily.     Ibuprofen (ADVIL PO) Take by mouth as needed.      Multiple Vitamin (MULTIVITAMIN) capsule Take 1 capsule by mouth daily.     ocrelizumab (OCREVUS) 300 MG/10ML injection Inject 600 mg into the vein. 300mg IV on day 1 &15. 600mg  IV q 6 months thereafter. [facility].     baclofen (LIORESAL) 10 MG tablet Take 0.5 tablets (5 mg total) by mouth 2 (two) times daily. 90 tablet 1   No facility-administered medications prior to visit.    PAST MEDICAL HISTORY: Past Medical History:  Diagnosis Date   Left spastic hemiparesis (HCC) 11/01/2018   Multiple sclerosis (HCC) 12/07/2018    PAST SURGICAL HISTORY: Past Surgical History:  Procedure Laterality Date   FINGER SURGERY     GALLBLADDER SURGERY     TUBAL LIGATION      FAMILY HISTORY: History reviewed. No pertinent family history.  SOCIAL HISTORY: Social History   Socioeconomic History   Marital status: Married    Spouse name: Not on file   Number of children: Not on file   Years of education: Not on file   Highest education level: Not on file  Occupational History   Not on file  Tobacco Use   Smoking status: Never   Smokeless tobacco: Never  Substance and Sexual Activity   Alcohol use: Never    Alcohol/week: 0.0 standard drinks   Drug use: Never   Sexual activity: Not on file  Other Topics Concern   Not on file  Social History Narrative   Coffee 2-3 cups per day    Lives at home with her husband    Social Determinants of Health   Financial Resource Strain: Not on file  Food Insecurity: Not on file  Transportation Needs: Not on file  Physical Activity: Not on file  Stress: Not on file  Social Connections: Not on file  Intimate Partner Violence: Not on file   PHYSICAL EXAM  Vitals:   01/01/21 0837   BP: 126/73  Pulse: 84  Weight: 145 lb 8 oz (66 kg)  Height: 5\' 6"  (1.676 m)     Body mass index is 23.48 kg/m.  Generalized: Well developed, in no acute distress  Neurological examination  Mentation: Alert oriented to time, place, history taking. Follows all commands speech and language fluent Cranial nerve II-XII: Pupils were equal round reactive to light. Extraocular movements were full, visual field were full on confrontational test. Facial sensation and strength were normal. Mild weakness left shoulder shrug, left shoulder is slightly lower at rest Motor: Good strength in all extremities, exception 4/5 left hip flexion; left foot drop  Sensory: Sensory testing is intact to soft touch on all 4 extremities. No evidence of extinction is noted.  Coordination: Cerebellar testing reveals good finger-nose-finger and heel-to-shin bilaterally, but is heavy to lift Gait and station: Circumduction type gait with the left leg, can walk independently, uses cane in hallways, not wearing AFO Reflexes: Deep tendon reflexes are symmetric but slightly increased in the knees  DIAGNOSTIC DATA (LABS, IMAGING, TESTING) - I reviewed patient records, labs, notes, testing and imaging myself where available.  Lab Results  Component Value Date   WBC 5.9 06/26/2020   HGB 12.7 06/26/2020   HCT 38.0 06/26/2020   MCV 95 06/26/2020   PLT 245 06/26/2020      Component Value Date/Time   NA 143 06/26/2020 0934   K 4.0 06/26/2020 0934   CL 105 06/26/2020 0934   CO2 23 06/26/2020 0934   GLUCOSE 84 06/26/2020 0934   BUN 15 06/26/2020 0934   CREATININE 0.72 06/26/2020 0934   CALCIUM 9.4 06/26/2020 0934   PROT 6.8 06/26/2020 0934   ALBUMIN 4.2 06/26/2020 0934   AST 14 06/26/2020 0934   ALT 13 06/26/2020 0934   ALKPHOS 63 06/26/2020 0934   BILITOT 0.5 06/26/2020 0934   GFRNONAA 93 12/27/2019 0926   GFRAA 107 12/27/2019 0926   No results found for: CHOL, HDL, LDLCALC, LDLDIRECT, TRIG, CHOLHDL No  results found for: HGBA1C No results found for: VITAMINB12 No results found for: TSH  ASSESSMENT AND PLAN 65 y.o. year old female  has a past medical history of Left spastic hemiparesis (HCC) (11/01/2018) and Multiple sclerosis (HCC) (12/07/2018). here with:  1.  Secondary progressive MS 2.  Gait disorder  -Chelsie is doing well, will continue Ocrevus, next infusion is in March 2023 -Check routine labs today -Check MRI of brain and cervical spine for routine surveillance (last were in Nov 2020 at time of diagnosis, showed plaque C2-3, multiple periventricular, subcortical, left mid brain hyperintensities) -Continue Baclofen 5 mg BID for spasticity  -Encouraged to get back into consistent exercise program, such as Silver Sneakers -Will now be followed by Dr. Dec 2020 since Dr. Marjory Lies has retired, I will see her back  in 6 months or sooner if needed  Otila Kluver, DNP 01/01/2021, 9:12 AM [facility] [address] [place], [place] [address] [phone]

## 2021-01-01 ENCOUNTER — Telehealth: Payer: Self-pay | Admitting: Neurology

## 2021-01-01 ENCOUNTER — Other Ambulatory Visit: Payer: Self-pay

## 2021-01-01 ENCOUNTER — Ambulatory Visit: Payer: PPO | Admitting: Neurology

## 2021-01-01 ENCOUNTER — Encounter: Payer: Self-pay | Admitting: Neurology

## 2021-01-01 VITALS — BP 126/73 | HR 84 | Ht 66.0 in | Wt 145.5 lb

## 2021-01-01 DIAGNOSIS — G35 Multiple sclerosis: Secondary | ICD-10-CM

## 2021-01-01 DIAGNOSIS — R29898 Other symptoms and signs involving the musculoskeletal system: Secondary | ICD-10-CM | POA: Diagnosis not present

## 2021-01-01 MED ORDER — BACLOFEN 10 MG PO TABS: 5.0000 mg | ORAL_TABLET | Freq: Two times a day (BID) | ORAL | 1 refills | Status: DC

## 2021-01-01 NOTE — Patient Instructions (Signed)
Continue Ocrevus infusion Check labs today  Check MRIs  See you back in 6 months

## 2021-01-01 NOTE — Telephone Encounter (Signed)
health team order sent to [facility], [facility] they will reach out to the patient to schedule.  

## 2021-01-02 LAB — CBC WITH DIFFERENTIAL/PLATELET
Basophils Absolute: 0.1 10*3/uL (ref 0.0–0.2)
Basos: 1 %
EOS (ABSOLUTE): 0.1 10*3/uL (ref 0.0–0.4)
Eos: 3 %
Hematocrit: 38.5 % (ref 34.0–46.6)
Hemoglobin: 12.8 g/dL (ref 11.1–15.9)
Immature Grans (Abs): 0 10*3/uL (ref 0.0–0.1)
Immature Granulocytes: 1 %
Lymphocytes Absolute: 0.7 10*3/uL (ref 0.7–3.1)
Lymphs: 14 %
MCH: 30.5 pg (ref 26.6–33.0)
MCHC: 33.2 g/dL (ref 31.5–35.7)
MCV: 92 fL (ref 79–97)
Monocytes Absolute: 0.7 10*3/uL (ref 0.1–0.9)
Monocytes: 13 %
Neutrophils Absolute: 3.6 10*3/uL (ref 1.4–7.0)
Neutrophils: 68 %
Platelets: 321 10*3/uL (ref 150–450)
RBC: 4.19 x10E6/uL (ref 3.77–5.28)
RDW: 11.5 % — ABNORMAL LOW (ref 11.7–15.4)
WBC: 5.2 10*3/uL (ref 3.4–10.8)

## 2021-01-02 LAB — COMPREHENSIVE METABOLIC PANEL
ALT: 14 IU/L (ref 0–32)
AST: 14 IU/L (ref 0–40)
Albumin/Globulin Ratio: 1.5 (ref 1.2–2.2)
Albumin: 4 g/dL (ref 3.8–4.8)
Alkaline Phosphatase: 71 IU/L (ref 44–121)
BUN/Creatinine Ratio: 11 — ABNORMAL LOW (ref 12–28)
BUN: 8 mg/dL (ref 8–27)
Bilirubin Total: 0.3 mg/dL (ref 0.0–1.2)
CO2: 26 mmol/L (ref 20–29)
Calcium: 9.6 mg/dL (ref 8.7–10.3)
Chloride: 105 mmol/L (ref 96–106)
Creatinine, Ser: 0.7 mg/dL (ref 0.57–1.00)
Globulin, Total: 2.6 g/dL (ref 1.5–4.5)
Glucose: 98 mg/dL (ref 70–99)
Potassium: 4.2 mmol/L (ref 3.5–5.2)
Sodium: 143 mmol/L (ref 134–144)
Total Protein: 6.6 g/dL (ref 6.0–8.5)
eGFR: 95 mL/min/{1.73_m2} (ref >59)

## 2021-01-02 LAB — IGG, IGA, IGM
IgA/Immunoglobulin A, Serum: 139 mg/dL (ref 87–352)
IgG (Immunoglobin G), Serum: 948 mg/dL (ref 586–1602)
IgM (Immunoglobulin M), Srm: 50 mg/dL (ref 26–217)

## 2021-01-20 ENCOUNTER — Other Ambulatory Visit: Payer: BLUE CROSS/BLUE SHIELD

## 2021-01-22 ENCOUNTER — Ambulatory Visit
Admission: RE | Admit: 2021-01-22 | Discharge: 2021-01-22 | Disposition: A | Discharge status: Home / Self Care | Payer: PPO | Source: Ambulatory Visit | Attending: Neurology | Admitting: Neurology

## 2021-01-22 ENCOUNTER — Other Ambulatory Visit: Payer: Self-pay

## 2021-01-22 DIAGNOSIS — G35 Multiple sclerosis: Secondary | ICD-10-CM

## 2021-01-22 MED ORDER — GADOBENATE DIMEGLUMINE 529 MG/ML IV SOLN
15.0000 mL | Freq: Once | INTRAVENOUS | Status: AC | PRN
  Administered 2021-01-22: 15 mL via INTRAVENOUS

## 2021-03-01 DIAGNOSIS — Z6822 Body mass index (BMI) 22.0-22.9, adult: Secondary | ICD-10-CM | POA: Diagnosis not present

## 2021-03-01 DIAGNOSIS — S90222A Contusion of left lesser toe(s) with damage to nail, initial encounter: Secondary | ICD-10-CM | POA: Diagnosis not present

## 2021-03-01 DIAGNOSIS — S46912A Strain of unspecified muscle, fascia and tendon at shoulder and upper arm level, left arm, initial encounter: Secondary | ICD-10-CM | POA: Diagnosis not present

## 2021-03-19 DIAGNOSIS — G35 Multiple sclerosis: Secondary | ICD-10-CM | POA: Diagnosis not present

## 2021-04-03 DIAGNOSIS — B351 Tinea unguium: Secondary | ICD-10-CM | POA: Diagnosis not present

## 2021-06-27 DIAGNOSIS — Z1231 Encounter for screening mammogram for malignant neoplasm of breast: Secondary | ICD-10-CM | POA: Diagnosis not present

## 2021-07-01 NOTE — Progress Notes (Unsigned)
PATIENT: Heather [name] DOB: April 25, 1955  REASON FOR VISIT: MS HISTORY FROM: Patient, daughter  Primary Neurologist: Dr. Anne Hahn, will now be followed by Dr. Marjory Lies  HISTORY OF PRESENT ILLNESS: Today 07/02/21 Heather [name] is here today for follow-up on Ocrevus, next infusion is in September.  Feels MS overall stable, tolerated Ocrevus well.  Chronic weakness to left side, gait instability.  Has AFO, does not wear consistently.  Uses cane as needed.  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  Does home exercise.  Has first appointment to see urology/gynecology for drop bladder next month.  Denies any new MS symptoms.  On baclofen twice daily, helps with spasticity.  MRI of the brain and cervical spine in January 2023 were overall stable, no new lesions.  On vitamin D supplement.  Update 01/01/2021 SS: Heather [name] is here today for follow-up. Remains on Ocrevus, last infusion was end of August 2022. Chronic left sided weakness, gait instability. Got a new left AFO brace, has helped. No recent falls. Uses cane when she goes out. Likes Ocrevus, does note some wearing off few weeks before infusion due. Taking baclofen 5 mg twice daily to help with spasticity. Her father passed away last week. She helps care for her mother and mother in law. She continues to be quite active, doesn't let this slow her down. No new MS symptoms. Does have a dropped bladder, considering seeing GYN/urology. Here with daughter.  History: Update 06/26/2020 SS: Heather [name] is a 66 year old female with history of secondary progressive MS.  Just diagnosed in November 2020.  Has weakness on the left side.  Is on Ocrevus, last infusion was in February 2022, next is in September.  Denies any adverse effect.  No falls, but will lean side to side, has left foot drop. Doesn't feel her AFO fits well, causes more gait instability.  She remains active, is caregiver for her elderly parents, her father has dementia, helping to care for him  in the home.  Hospice is involved.  Taking baclofen 5 mg at bedtime, does help with muscle tightness. Reports her bladder has dropped, unrelated to MS. Uses a cane when she goes out.  Had a mild case of COVID in January.  No other health issues.  Just drove her daughter to [place] yesterday for [employer] summer program. She doesn't let MS slow her down.  Here today for follow-up unaccompanied.   REVIEW OF SYSTEMS: Out of a complete 14 system review of symptoms, the patient complains only of the following symptoms, and all other reviewed systems are negative.  See HPI  ALLERGIES: Allergies  Allergen Reactions   Erythromycin    Penicillins     HOME MEDICATIONS: Outpatient Medications Prior to Visit  Medication Sig Dispense Refill   Cholecalciferol (VITAMIN D) 50 MCG (2000 UT) tablet Take 2,000 Units by mouth daily.     Ibuprofen (ADVIL PO) Take by mouth as needed.      Multiple Vitamin (MULTIVITAMIN) capsule Take 1 capsule by mouth daily.     ocrelizumab (OCREVUS) 300 MG/10ML injection Inject 600 mg into the vein. 300mg IV on day 1 &15. 600mg  IV q 6 months thereafter. [facility].     baclofen (LIORESAL) 10 MG tablet Take 0.5 tablets (5 mg total) by mouth 2 (two) times daily. 90 tablet 1   No facility-administered medications prior to visit.    PAST MEDICAL HISTORY: Past Medical History:  Diagnosis Date   Left spastic hemiparesis (HCC) 11/01/2018   Multiple sclerosis (HCC)  12/07/2018    PAST SURGICAL HISTORY: Past Surgical History:  Procedure Laterality Date   FINGER SURGERY     GALLBLADDER SURGERY     TUBAL LIGATION      FAMILY HISTORY: No family history on file.  SOCIAL HISTORY: Social History   Socioeconomic History   Marital status: Married    Spouse name: Not on file   Number of children: Not on file   Years of education: Not on file   Highest education level: Not on file  Occupational History   Not on file  Tobacco Use   Smoking status: Never   Smokeless  tobacco: Never  Substance and Sexual Activity   Alcohol use: Never    Alcohol/week: 0.0 standard drinks of alcohol   Drug use: Never   Sexual activity: Not on file  Other Topics Concern   Not on file  Social History Narrative   Coffee 2-3 cups per day    Lives at home with her husband    Social Determinants of Health   Financial Resource Strain: Not on file  Food Insecurity: Not on file  Transportation Needs: Not on file  Physical Activity: Not on file  Stress: Not on file  Social Connections: Not on file  Intimate Partner Violence: Not on file   PHYSICAL EXAM  Vitals:   07/02/21 0835  BP: 122/71  Pulse: 87  Weight: 141 lb (64 kg)  Height: 5\' 6"  (1.676 m)   Body mass index is 22.76 kg/m.  Generalized: Well developed, in no acute distress  Neurological examination  Mentation: Alert oriented to time, place, history taking. Follows all commands speech and language fluent Cranial nerve II-XII: Pupils were equal round reactive to light. Extraocular movements were full, visual field were full on confrontational test. Facial sensation and strength were normal.  Motor: Good strength in all extremities, exception 3/5 left hip flexion; left foot drop  Sensory: Sensory testing is intact to soft touch on all 4 extremities. No evidence of extinction is noted.  Coordination: Cerebellar testing reveals good finger-nose-finger and heel-to-shin bilaterally, but is heavy to lift Gait and station: Circumduction type gait with the left leg, can walk independently, uses cane in hallways, not wearing AFO Reflexes: Deep tendon reflexes are symmetric but slightly increased in the knees  DIAGNOSTIC DATA (LABS, IMAGING, TESTING) - I reviewed patient records, labs, notes, testing and imaging myself where available.  Lab Results  Component Value Date   WBC 5.2 01/01/2021   HGB 12.8 01/01/2021   HCT 38.5 01/01/2021   MCV 92 01/01/2021   PLT 321 01/01/2021      Component Value Date/Time    NA 143 01/01/2021 0917   K 4.2 01/01/2021 0917   CL 105 01/01/2021 0917   CO2 26 01/01/2021 0917   GLUCOSE 98 01/01/2021 0917   BUN 8 01/01/2021 0917   CREATININE 0.70 01/01/2021 0917   CALCIUM 9.6 01/01/2021 0917   PROT 6.6 01/01/2021 0917   ALBUMIN 4.0 01/01/2021 0917   AST 14 01/01/2021 0917   ALT 14 01/01/2021 0917   ALKPHOS 71 01/01/2021 0917   BILITOT 0.3 01/01/2021 0917   GFRNONAA 93 12/27/2019 0926   GFRAA 107 12/27/2019 0926   No results found for: "CHOL", "HDL", "LDLCALC", "LDLDIRECT", "TRIG", "CHOLHDL" No results found for: "HGBA1C" No results found for: "VITAMINB12" No results found for: "TSH"  ASSESSMENT AND PLAN 66 y.o. year old female   1.  Secondary progressive MS 2.  Gait disorder  -Remains overall stable, will  continue Ocrevus, next infusion is in September 2023 -Check routine labs -MRI of the brain and cervical spine with and without contrast were overall stable in January 2023 -Continue baclofen 5 mg twice daily for spasticity -Encouraged to continue exercise program, use AFO -Follow-up in 6 months with Dr. Marjory Lies to establish MS care  Margie Ege, AGNP-C, DNP 07/02/2021, 9:40 AM [facility] [address], [address] [place], [place] [address] [phone]

## 2021-07-02 ENCOUNTER — Ambulatory Visit: Payer: PPO | Admitting: Neurology

## 2021-07-02 ENCOUNTER — Encounter: Payer: Self-pay | Admitting: Neurology

## 2021-07-02 VITALS — BP 122/71 | HR 87 | Ht 66.0 in | Wt 141.0 lb

## 2021-07-02 DIAGNOSIS — R29898 Other symptoms and signs involving the musculoskeletal system: Secondary | ICD-10-CM

## 2021-07-02 DIAGNOSIS — G35 Multiple sclerosis: Secondary | ICD-10-CM

## 2021-07-02 MED ORDER — BACLOFEN 10 MG PO TABS
5.0000 mg | ORAL_TABLET | Freq: Two times a day (BID) | ORAL | 1 refills | Status: DC
Start: 2021-07-02 — End: 2022-02-18

## 2021-07-03 LAB — IGG, IGA, IGM
IgA/Immunoglobulin A, Serum: 148 mg/dL (ref 87–352)
IgG (Immunoglobin G), Serum: 924 mg/dL (ref 586–1602)
IgM (Immunoglobulin M), Srm: 50 mg/dL (ref 26–217)

## 2021-07-03 LAB — CBC WITH DIFFERENTIAL/PLATELET
Basophils Absolute: 0.1 10*3/uL (ref 0.0–0.2)
Basos: 2 %
EOS (ABSOLUTE): 0.1 10*3/uL (ref 0.0–0.4)
Eos: 2 %
Hematocrit: 39.4 % (ref 34.0–46.6)
Hemoglobin: 13.1 g/dL (ref 11.1–15.9)
Immature Grans (Abs): 0 10*3/uL (ref 0.0–0.1)
Immature Granulocytes: 0 %
Lymphocytes Absolute: 0.8 10*3/uL (ref 0.7–3.1)
Lymphs: 16 %
MCH: 31.4 pg (ref 26.6–33.0)
MCHC: 33.2 g/dL (ref 31.5–35.7)
MCV: 95 fL (ref 79–97)
Monocytes Absolute: 0.6 10*3/uL (ref 0.1–0.9)
Monocytes: 13 %
Neutrophils Absolute: 3.1 10*3/uL (ref 1.4–7.0)
Neutrophils: 67 %
Platelets: 260 10*3/uL (ref 150–450)
RBC: 4.17 x10E6/uL (ref 3.77–5.28)
RDW: 11.8 % (ref 11.7–15.4)
WBC: 4.7 10*3/uL (ref 3.4–10.8)

## 2021-07-03 LAB — COMPREHENSIVE METABOLIC PANEL
ALT: 13 IU/L (ref 0–32)
AST: 13 IU/L (ref 0–40)
Albumin/Globulin Ratio: 2.1 (ref 1.2–2.2)
Albumin: 4.4 g/dL (ref 3.8–4.8)
Alkaline Phosphatase: 64 IU/L (ref 44–121)
BUN/Creatinine Ratio: 18 (ref 12–28)
BUN: 11 mg/dL (ref 8–27)
Bilirubin Total: <0.2 mg/dL (ref 0.0–1.2)
CO2: 24 mmol/L (ref 20–29)
Calcium: 9.5 mg/dL (ref 8.7–10.3)
Chloride: 108 mmol/L — ABNORMAL HIGH (ref 96–106)
Creatinine, Ser: 0.61 mg/dL (ref 0.57–1.00)
Globulin, Total: 2.1 g/dL (ref 1.5–4.5)
Glucose: 84 mg/dL (ref 70–99)
Potassium: 4.4 mmol/L (ref 3.5–5.2)
Sodium: 144 mmol/L (ref 134–144)
Total Protein: 6.5 g/dL (ref 6.0–8.5)
eGFR: 98 mL/min/{1.73_m2} (ref >59)

## 2021-07-03 LAB — VITAMIN D 25 HYDROXY (VIT D DEFICIENCY, FRACTURES): Vit D, 25-Hydroxy: 46.7 ng/mL (ref 30.0–100.0)

## 2021-07-08 DIAGNOSIS — B351 Tinea unguium: Secondary | ICD-10-CM | POA: Diagnosis not present

## 2021-07-30 ENCOUNTER — Ambulatory Visit (INDEPENDENT_AMBULATORY_CARE_PROVIDER_SITE_OTHER): Payer: PPO | Admitting: Obstetrics and Gynecology

## 2021-07-30 ENCOUNTER — Encounter: Payer: Self-pay | Admitting: Obstetrics and Gynecology

## 2021-07-30 VITALS — BP 115/72 | HR 111 | Ht 62.25 in | Wt 138.0 lb

## 2021-07-30 DIAGNOSIS — R350 Frequency of micturition: Secondary | ICD-10-CM | POA: Diagnosis not present

## 2021-07-30 DIAGNOSIS — N393 Stress incontinence (female) (male): Secondary | ICD-10-CM

## 2021-07-30 DIAGNOSIS — N8110 Cystocele, unspecified: Secondary | ICD-10-CM

## 2021-07-30 DIAGNOSIS — N950 Postmenopausal bleeding: Secondary | ICD-10-CM | POA: Diagnosis not present

## 2021-07-30 DIAGNOSIS — N816 Rectocele: Secondary | ICD-10-CM

## 2021-07-30 DIAGNOSIS — N813 Complete uterovaginal prolapse: Secondary | ICD-10-CM

## 2021-07-30 LAB — POCT URINALYSIS DIPSTICK
Bilirubin, UA: NEGATIVE
Blood, UA: NEGATIVE
Glucose, UA: NEGATIVE
Ketones, UA: NEGATIVE
Leukocytes, UA: NEGATIVE
Nitrite, UA: NEGATIVE
Protein, UA: NEGATIVE
Spec Grav, UA: 1.025 (ref 1.010–1.025)
Urobilinogen, UA: 0.2 E.U./dL
pH, UA: 7 (ref 5.0–8.0)

## 2021-07-30 NOTE — Progress Notes (Signed)
[facility] New Patient Evaluation and Consultation  Referring Provider: Street, Stephanie Coup, * PCP: Street, Stephanie Coup, MD Date of Service: 07/30/2021  SUBJECTIVE Chief Complaint: New Patient (Initial Visit) Heather [name] is a 66 y.o. femaler here for an evaluation on prolapse. )  History of Present Illness: Heather [name] is a 66 y.o.  female seen in consultation presenting for evaluation of prolapse   Urinary Symptoms: Leaks urine with cough/ sneeze and with a full bladder, but not always.  Leaks occasionally, not always aware of when it is happening because she wears a pad.  Pad use: 1 pads per day.   She is not bothered by her UI symptoms.  Day time voids 6.  Nocturia: 1 times per night to void. Voiding dysfunction: she sometimes empties her bladder well.  does not use a catheter to empty bladder.  When urinating, she feels a weak stream  UTIs:  0  UTI's in the last year.   Denies history of blood in urine and kidney or bladder stones  Pelvic Organ Prolapse Symptoms:                  She Admits to a feeling of a bulge the vaginal area. It has been present for 11 months. Has been told for several years that her bladder is dropping.  She Admits to seeing a bulge.  This bulge is bothersome. No prior treatments  Bowel Symptom: Bowel movements: 1 time(s) per day Stool consistency: soft  Straining: no.  Splinting: no.  Incomplete evacuation: no.  She Denies accidental bowel leakage / fecal incontinence Bowel regimen: none  Sexual Function Sexually active: yes.  Pain with sex: No  Pelvic Pain Denies pelvic pain   Past Medical History:  Past Medical History:  Diagnosis Date   Left spastic hemiparesis (HCC) 11/01/2018   Mitral valve prolapse    Multiple sclerosis (HCC) 12/07/2018     Past Surgical History:   Past Surgical History:  Procedure Laterality Date   FINGER SURGERY     GALLBLADDER SURGERY     TUBAL LIGATION       Past OB/GYN  History: OB History  Gravida Para Term Preterm AB Living  2         2  SAB IAB Ectopic Multiple Live Births          2    # Outcome Date GA Lbr Len/2nd Weight Sex Delivery Anes PTL Lv  2 Gravida           1 Gravida             Vaginal deliveries: 2,  Forceps used on the first delivery.  Menopausal: Yes, at age 59, Admits to vaginal bleeding since menopause. She has noticed bleeding on and off for several months.  Has been several years since she had a pap smear, last one was with a PCP.    Medications: She has a current medication list which includes the following prescription(s): baclofen, vitamin d, ibuprofen, multivitamin, and ocrevus.   Allergies: Patient is allergic to erythromycin and penicillins.   Social History:  Social History   Tobacco Use   Smoking status: Never   Smokeless tobacco: Never  Vaping Use   Vaping Use: Never used  Substance Use Topics   Alcohol use: Never    Alcohol/week: 0.0 standard drinks of alcohol   Drug use: Never    Relationship status: married She lives with husband.   She is not employed. Regular exercise: No  History of abuse: No  Family History:   Family History  Problem Relation Age of Onset   Peripheral Artery Disease Mother    Heart disease Father    Stroke Father      Review of Systems: Review of Systems  Constitutional:  Negative for fever, malaise/fatigue and weight loss.  Respiratory:  Negative for cough, shortness of breath and wheezing.   Cardiovascular:  Negative for chest pain, palpitations and leg swelling.  Gastrointestinal:  Negative for abdominal pain and blood in stool.  Genitourinary:  Negative for dysuria.  Musculoskeletal:  Positive for myalgias.  Skin:  Negative for rash.  Neurological:  Positive for headaches. Negative for dizziness.  Endo/Heme/Allergies:  Does not bruise/bleed easily.  Psychiatric/Behavioral:  Negative for depression. The patient is not nervous/anxious.      OBJECTIVE Physical  Exam: Vitals:   07/30/21 1524  BP: 115/72  Pulse: (!) 111  Weight: 138 lb (62.6 kg)  Height: 5' 2.25" (1.581 m)    Physical Exam Constitutional:      General: She is not in acute distress. Pulmonary:     Effort: Pulmonary effort is normal.  Abdominal:     General: There is no distension.     Palpations: Abdomen is soft.     Tenderness: There is no abdominal tenderness. There is no rebound.  Musculoskeletal:        General: No swelling. Normal range of motion.  Skin:    General: Skin is warm and dry.     Findings: No rash.  Neurological:     Mental Status: She is alert and oriented to person, place, and time.  Psychiatric:        Mood and Affect: Mood normal.        Behavior: Behavior normal.      GU / Detailed Urogynecologic Evaluation:  Pelvic Exam: Normal external female genitalia; Bartholin's and Skene's glands normal in appearance; urethral meatus normal in appearance, no urethral masses or discharge.   CST: negative  Complete uterine procedentia noted.  Normal vaginal mucosa with atrophy. Cervix normal appearance. Uterus normal single, nontender. Adnexa no mass, fullness, tenderness.    Pelvic floor strength I/V  Pelvic floor musculature: Right levator non-tender, Right obturator non-tender, Left levator non-tender, Left obturator non-tender  POP-Q:   POP-Q  3                                            Aa   7                                           Ba  7                                              C   6.5                                            Gh  3.5  Pb  7                                            tvl   3                                            Ap  7                                            Bp  -4                                              D   Catheterized urine specimen was obtained, since first specimen contained blood but patient reported vaginal bleeding. 125cc clear urine drained.  POC urine negative.   Rectal Exam:  Normal external rectum  Post-Void Residual (PVR) by Bladder Scan: In order to evaluate bladder emptying, we discussed obtaining a postvoid residual and she agreed to this procedure.  Procedure: The ultrasound unit was placed on the patient's abdomen in the suprapubic region after the patient had voided. A PVR of 85 ml was obtained by bladder scan.  Laboratory Results: POC urine: negative  ASSESSMENT AND PLAN Ms. Boltz is a 66 y.o. with:  1. Uterovaginal prolapse, complete   2. Prolapse of anterior vaginal wall   3. Prolapse of posterior vaginal wall   4. SUI (stress urinary incontinence, female)   5. Urinary frequency   6. Post-menopausal bleeding    Stage IV anterior, Stage IV posterior, Stage IV apical prolapse - For treatment of pelvic organ prolapse, we discussed options for management including expectant management, conservative management, and surgical management, such as Kegels, a pessary, pelvic floor physical therapy, and specific surgical procedures. - We discussed vaginal vs robotic surgical procedures. Recommended robotic sacrocolpopexy due to severity of her prolapse if she were to choose surgery. Handout provided on this option.  - Also provided information on a pessary. She is considering this and will let us know what she decides.   2. SUI - For treatment of stress urinary incontinence,  non-surgical options include expectant management, weight loss, physical therapy, as well as a pessary.  Surgical options include a midurethral sling, Burch urethropexy, and transurethral injection of a bulking agent. - If she wants to proceed with surgery, will need urodynamic testing to evaluate further.   3. PMB - Transvaginal US ordered to assess endometrial lining. Discussed need for possible biopsy.     Marguerita Beards, MD

## 2021-08-08 ENCOUNTER — Ambulatory Visit (HOSPITAL_COMMUNITY)
Admission: RE | Admit: 2021-08-08 | Discharge: 2021-08-08 | Disposition: A | Discharge status: Home / Self Care | Payer: PPO | Source: Ambulatory Visit | Attending: Obstetrics and Gynecology | Admitting: Obstetrics and Gynecology

## 2021-08-08 DIAGNOSIS — N950 Postmenopausal bleeding: Secondary | ICD-10-CM | POA: Insufficient documentation

## 2021-08-12 ENCOUNTER — Telehealth: Payer: Self-pay | Admitting: Obstetrics and Gynecology

## 2021-08-12 NOTE — Addendum Note (Signed)
Addended by: Marguerita Beards on: 08/12/2021 05:41 PM   Modules accepted: Orders

## 2021-08-12 NOTE — Telephone Encounter (Signed)
Spoke with patient re: TVUS results and need for further workup with general GYN.

## 2021-09-03 DIAGNOSIS — N819 Female genital prolapse, unspecified: Secondary | ICD-10-CM | POA: Diagnosis not present

## 2021-09-03 DIAGNOSIS — N950 Postmenopausal bleeding: Secondary | ICD-10-CM | POA: Diagnosis not present

## 2021-09-03 DIAGNOSIS — Z124 Encounter for screening for malignant neoplasm of cervix: Secondary | ICD-10-CM | POA: Diagnosis not present

## 2021-09-03 DIAGNOSIS — Z1151 Encounter for screening for human papillomavirus (HPV): Secondary | ICD-10-CM | POA: Diagnosis not present

## 2021-09-03 DIAGNOSIS — N710 Acute inflammatory disease of uterus: Secondary | ICD-10-CM | POA: Diagnosis not present

## 2021-09-18 DIAGNOSIS — G35 Multiple sclerosis: Secondary | ICD-10-CM | POA: Diagnosis not present

## 2021-10-10 DIAGNOSIS — N950 Postmenopausal bleeding: Secondary | ICD-10-CM | POA: Diagnosis not present

## 2021-11-04 ENCOUNTER — Encounter: Payer: Self-pay | Admitting: *Deleted

## 2021-12-31 ENCOUNTER — Ambulatory Visit: Payer: PPO | Admitting: Diagnostic Neuroimaging

## 2021-12-31 ENCOUNTER — Encounter: Payer: Self-pay | Admitting: Diagnostic Neuroimaging

## 2021-12-31 VITALS — BP 118/75 | HR 82 | Ht 63.0 in | Wt 141.0 lb

## 2021-12-31 DIAGNOSIS — G35 Multiple sclerosis: Secondary | ICD-10-CM
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
## 2022-02-18 ENCOUNTER — Other Ambulatory Visit: Payer: Self-pay | Admitting: Neurology

## 2022-02-19 DIAGNOSIS — E785 Hyperlipidemia, unspecified: Secondary | ICD-10-CM | POA: Diagnosis not present

## 2022-02-19 DIAGNOSIS — R739 Hyperglycemia, unspecified: Secondary | ICD-10-CM | POA: Diagnosis not present

## 2022-02-19 DIAGNOSIS — Z79899 Other long term (current) drug therapy: Secondary | ICD-10-CM | POA: Diagnosis not present

## 2022-02-24 DIAGNOSIS — Z6822 Body mass index (BMI) 22.0-22.9, adult: Secondary | ICD-10-CM | POA: Diagnosis not present

## 2022-02-24 DIAGNOSIS — N814 Uterovaginal prolapse, unspecified: Secondary | ICD-10-CM | POA: Diagnosis not present

## 2022-02-24 DIAGNOSIS — F430 Acute stress reaction: Secondary | ICD-10-CM | POA: Diagnosis not present

## 2022-02-24 DIAGNOSIS — Z9181 History of falling: Secondary | ICD-10-CM | POA: Diagnosis not present

## 2022-02-24 DIAGNOSIS — Z2821 Immunization not carried out because of patient refusal: Secondary | ICD-10-CM | POA: Diagnosis not present

## 2022-02-24 DIAGNOSIS — Z1331 Encounter for screening for depression: Secondary | ICD-10-CM | POA: Diagnosis not present

## 2022-02-24 DIAGNOSIS — M8580 Other specified disorders of bone density and structure, unspecified site: Secondary | ICD-10-CM | POA: Diagnosis not present

## 2022-02-24 DIAGNOSIS — Z0000 Encounter for general adult medical examination without abnormal findings: Secondary | ICD-10-CM | POA: Diagnosis not present

## 2022-02-24 DIAGNOSIS — N393 Stress incontinence (female) (male): Secondary | ICD-10-CM | POA: Diagnosis not present

## 2022-02-24 DIAGNOSIS — G35 Multiple sclerosis: Secondary | ICD-10-CM | POA: Diagnosis not present

## 2022-02-26 ENCOUNTER — Encounter: Payer: Self-pay | Admitting: Obstetrics and Gynecology

## 2022-02-26 ENCOUNTER — Ambulatory Visit (INDEPENDENT_AMBULATORY_CARE_PROVIDER_SITE_OTHER): Payer: PPO | Admitting: Obstetrics and Gynecology

## 2022-02-26 VITALS — BP 129/77 | HR 67

## 2022-02-26 DIAGNOSIS — N813 Complete uterovaginal prolapse: Secondary | ICD-10-CM

## 2022-02-26 DIAGNOSIS — N816 Rectocele: Secondary | ICD-10-CM

## 2022-02-26 DIAGNOSIS — N393 Stress incontinence (female) (male): Secondary | ICD-10-CM | POA: Diagnosis not present

## 2022-02-26 DIAGNOSIS — N8110 Cystocele, unspecified: Secondary | ICD-10-CM

## 2022-02-26 NOTE — Progress Notes (Addendum)
[facility] Return Visit  SUBJECTIVE  History of Present Illness: Heather [name] is a 67 y.o. female seen in follow-up for prolapse.  Had EMB in Oct 2023 at [facility] which was negative (results in media). Has occasional spotting due to dryness/ rubbing. Prolapse is getting more bothersome for her.   She does leak some but more with urgency. Has some difficulty with urination when uterus and bladder is hanging out a lot.   Past Medical History: Patient  has a past medical history of Left spastic hemiparesis ([place]) (11/01/2018), Mitral valve prolapse, and Multiple sclerosis ([place]) (12/07/2018).   Past Surgical History: She  has a past surgical history that includes Tubal ligation; Gallbladder surgery; and Finger surgery.   Medications: She has a current medication list which includes the following prescription(s): ascorbic acid, baclofen, vitamin d, ibuprofen, multivitamin, and ocrevus.   Allergies: Patient is allergic to erythromycin and penicillins.   Social History: Patient  reports that she has never smoked. She has never used smokeless tobacco. She reports that she does not drink alcohol and does not use drugs.      OBJECTIVE     Physical Exam: Vitals:   02/26/22 1020  BP: 129/77  Pulse: 67   Gen: No apparent distress, A&O x 3.  Detailed Urogynecologic Evaluation:  Deferred. Prior exam showed:  POP-Q (07/30/21):    POP-Q   3                                            Aa   7                                           Ba   7                                              C    6.5                                            Gh   3.5                                            Pb   7                                            tvl    3                                            Ap   7  Bp   -4                                              D      ASSESSMENT AND PLAN    [name] is a 67 y.o. with:   1. Uterovaginal prolapse, complete   2. Prolapse of anterior vaginal wall   3. Prolapse of posterior vaginal wall   4. SUI (stress urinary incontinence, female)    - Will need urodynamic testing to evaluate bladder emptying. Discussed the possibility of a sling for stress incontinence.   - We reviewed the patient's specific anatomic and functional findings, with the assistance of diagrams, and together finalized the above procedure. The planned surgical procedures were discussed along with the surgical risks outlined below, which were also provided on a detailed handout. Additional treatment options including expectant management, conservative management, medical management were discussed where appropriate.  We reviewed the benefits and risks of each treatment option.  - Plan for surgery: Exam under anesthesia, robotic assisted total laparoscopic hysterectomy , bilateral salpingo-oophorectomy, Sacrocolpopexy, possible posterior repair and perineorrhaphy, midurethral sling, cystoscopy  - Medical clearance: required from Neurology. She is concerned about her increased immune risk with Ocrevus which she gets for her MS every 6 months. Letter sent to Dr Leta Baptist requesting risk stratification and medical optimization and to discuss timing of medication infusion.  - Anticoagulant use: No - Medicaid Hysterectomy form: No - Accepts blood transfusion: Yes - Expected length of stay: outpatient  Request sent for surgery scheduling. She will return for urodynamic testing.   Jaquita Folds, MD     Wants surgery in August,

## 2022-03-03 ENCOUNTER — Encounter: Payer: Self-pay | Admitting: *Deleted

## 2022-03-06 ENCOUNTER — Encounter: Payer: Self-pay | Admitting: Obstetrics and Gynecology

## 2022-03-12 ENCOUNTER — Ambulatory Visit (INDEPENDENT_AMBULATORY_CARE_PROVIDER_SITE_OTHER): Payer: PPO | Admitting: Obstetrics and Gynecology

## 2022-03-12 ENCOUNTER — Encounter: Payer: Self-pay | Admitting: Obstetrics and Gynecology

## 2022-03-12 VITALS — BP 108/74 | HR 78

## 2022-03-12 DIAGNOSIS — R350 Frequency of micturition: Secondary | ICD-10-CM

## 2022-03-12 DIAGNOSIS — N393 Stress incontinence (female) (male): Secondary | ICD-10-CM

## 2022-03-12 DIAGNOSIS — N813 Complete uterovaginal prolapse: Secondary | ICD-10-CM | POA: Diagnosis not present

## 2022-03-12 LAB — POCT URINALYSIS DIPSTICK
Bilirubin, UA: NEGATIVE
Blood, UA: NEGATIVE
Glucose, UA: NEGATIVE
Ketones, UA: NEGATIVE
Leukocytes, UA: NEGATIVE
Nitrite, UA: NEGATIVE
Protein, UA: NEGATIVE
Spec Grav, UA: <=1.005 — AB (ref 1.010–1.025)
Urobilinogen, UA: 0.2 E.U./dL
pH, UA: 6 (ref 5.0–8.0)

## 2022-03-12 NOTE — Progress Notes (Signed)
[facility] Urodynamics Procedure  Referring Physician: Street, Sharon Mt, * Date of Procedure: 03/12/2022  Heather [name] is a 67 y.o. female who presents for urodynamic evaluation. Indication(s) for study: SUI  Vital Signs: BP 108/74   Pulse 78   Laboratory Results: A catheterized urine specimen revealed:  POC urine: Negative for all components   Voiding Diary: Not done  Procedure Timeout:  The correct patient was verified and the correct procedure was verified. The patient was in the correct position and safety precautions were reviewed based on at the patient's history.  Urodynamic Procedure A 89F dual lumen urodynamics catheter was placed under sterile conditions into the patient's bladder. A 89F catheter was placed into the rectum in order to measure abdominal pressure. EMG patches were placed in the appropriate position.  All connections were confirmed and calibrations/adjusted made. Saline was instilled into the bladder through the dual lumen catheters.  Cough/valsalva pressures were measured periodically during filling.  Patient had to be refilled as she had a leakage event which resulted in emptying her bladder Patient was allowed to void.  The bladder was then emptied of its residual.  UROFLOW: Revealed a Qmax of   mL/sec.  She voided 479.2 mL and had a residual of 75 mL.  It was a interrupted pattern and represented normal habits. CMG: This was performed with sterile water in the sitting position at a fill rate of 20-40 mL/min.    First sensation of fullness was 126 mLs,  First urge was 174 mLs,  Strong urge was 278 mLs and  Capacity was 278 mLs  Stress incontinence was demonstrated Highest positive Barrier CLPP was 67 cmH20 at 199 ml. Highest negative Barrier VLPP was 50 cmH20 at 199 ml.  Detrusor function was overactive, with phasic contractions seen.  The first occurred at 277 mL to 28 cm of water and was associated with leakage.  Compliance:   Normal. End fill detrusor pressure was 4.5cmH20.  Calculated compliance was 25m/cmH20  UPP: MUCP with barrier reduction was 32 cm of water.    MICTURITION STUDY: Voiding was performed with reduction using scopettes in the sitting position.  Pdet at Qmax was 4.2 cm of water.  Qmax was 8 mL/sec.  It was a normal pattern.  She voided 246 mL and had a residual of 25 mL.  It was not a volitional void, sustained detrusor contraction was not present and abdominal straining was not present  EMG: This was performed with patches.  She had voluntary contractions, recruitment with fill was not present and urethral sphincter was relaxed with void.  The details of the procedure with the study tracings have been scanned into EPIC.   Urodynamic Impression:  1. Sensation was normal; capacity was reduced 2. Stress Incontinence was demonstrated at normal pressures; 3. Detrusor Overactivity was demonstrated with leakage. 4. Emptying was normal with a normal PVR, a sustained detrusor contraction not present,  abdominal straining not present, normal urethral sphincter activity on EMG.  Plan: - The patient will follow up  to discuss the findings and treatment options.

## 2022-03-12 NOTE — Patient Instructions (Signed)
Taking Care of Yourself after Urodynamics  Drink plenty of water for a day or two following your procedure. Try to have about 8 ounces (one cup) at a time, and do this 6 times or more per day unless you have fluid restrictitons AVOID irritative beverages such as coffee, tea, soda, alcoholic or citrus drinks for a day or two, as this may cause burning with urination.  For the first 1-2 days after the procedure, your urine may be pink or red in color. You may have some blood in your urine as a normal side effect of the procedure. Large amounts of bleeding or difficulty urinating are NOT normal. Call the nurse line if this happens or go to the nearest Emergency Room if the bleeding is heavy or you cannot urinate at all and it is after hours. If you had a Bulkamid injection in the urethra and need to be catheterized, ask for a pediatric catheter to be used (size 10 or 12-French) so the material is not pushed out of place.   You may experience some discomfort or a burning sensation with urination after having this procedure. You can use over the counter Azo or pyridium to help with burning and follow the instructions on the packaging. If it does not improve within 1-2 days, or other symptoms appear (fever, chills, or difficulty urinating) call the office to speak to a nurse.  You may return to normal daily activities such as work, school, driving, exercising and housework on the day of the procedure.

## 2022-03-12 NOTE — Progress Notes (Addendum)
[facility]   Subjective:     Chief Complaint: Urodynamics Heather [name] is a 67 y.o. female is here to demonstrate leakge)  History of Present Illness: Heather [name] is a 66 y.o. female with stage IV pelvic organ prolapse who presents today for a pessary fitting.    Past Medical History: Patient  has a past medical history of Left spastic hemiparesis ([place]) (11/01/2018), Mitral valve prolapse, and Multiple sclerosis ([place]) (12/07/2018).   Past Surgical History: She  has a past surgical history that includes Tubal ligation; Gallbladder surgery; and Finger surgery.   Medications: She has a current medication list which includes the following prescription(s): ascorbic acid, baclofen, vitamin d, ibuprofen, multivitamin, and ocrevus.   Allergies: Patient is allergic to erythromycin and penicillins.   Social History: Patient  reports that she has never smoked. She has never used smokeless tobacco. She reports that she does not drink alcohol and does not use drugs.      Objective:    BP 108/74   Pulse 78  Gen: No apparent distress, A&O x 3. Pelvic Exam: Normal external female genitalia; Bartholin's and Skene's glands normal in appearance; urethral meatus normal in appearance, no urethral masses or discharge.   A size #5 short stem gellhorn pessary was fitted. It was comfortable, stayed in place with valsalva and was an appropriate size on examination, with one finger fitting between the pessary and the vaginal walls. We tied a string to it and the patient demonstrated proper removal and replacement.   Assessment/Plan:    Assessment: Heather [name] is a 68 y.o. with stage IV pelvic organ prolapse who presents for a pessary fitting. Plan: She was fitted with a #5 short stem gellhorn pessary. She will keep the pessary in place until next visit. She will use vitamin E oil.   Follow-up in 3 weeks for a pessary check or sooner as needed.  All questions were answered.     Berton Mount, NP

## 2022-03-19 DIAGNOSIS — G35 Multiple sclerosis: Secondary | ICD-10-CM | POA: Diagnosis not present

## 2022-03-27 ENCOUNTER — Telehealth: Payer: Self-pay | Admitting: Diagnostic Neuroimaging

## 2022-03-27 NOTE — Telephone Encounter (Signed)
Pt is asking for a call to discuss if her upcoming surgery should be done before or after her upcoming infusion, please call.

## 2022-03-27 NOTE — Telephone Encounter (Signed)
Called the patient and the first available time frame they have is 08/26/2022. Pt will go ahead and schedule that since its first available and advised I would make sure Dr Leta Baptist is aware in case he feels that is too close to next infusion. If so, advised the patient I would call her back. She was appreciative for the call back.

## 2022-03-27 NOTE — Telephone Encounter (Signed)
Pt had her ocrevus infusion completed on 03/19/2022 due sept 2024. Is there a certain time frame that should pass before she has this surgery scheduled?   -" Plan for surgery: Exam under anesthesia, robotic assisted total laparoscopic hysterectomy , bilateral salpingo-oophorectomy, Sacrocolpopexy, possible posterior repair and perineorrhaphy, midurethral sling, cystoscopy   - Medical clearance: required from Neurology. She is concerned about her increased immune risk with Ocrevus which she gets for her MS every 6 months. Letter sent to Dr Leta Baptist requesting risk stratification and medical optimization and to discuss timing of medication infusion."

## 2022-04-01 ENCOUNTER — Encounter: Payer: Self-pay | Admitting: Obstetrics and Gynecology

## 2022-04-01 ENCOUNTER — Ambulatory Visit (INDEPENDENT_AMBULATORY_CARE_PROVIDER_SITE_OTHER): Payer: PPO | Admitting: Obstetrics and Gynecology

## 2022-04-01 VITALS — BP 118/78

## 2022-04-01 DIAGNOSIS — N813 Complete uterovaginal prolapse: Secondary | ICD-10-CM

## 2022-04-01 DIAGNOSIS — N816 Rectocele: Secondary | ICD-10-CM

## 2022-04-01 DIAGNOSIS — N8110 Cystocele, unspecified: Secondary | ICD-10-CM

## 2022-04-01 DIAGNOSIS — N393 Stress incontinence (female) (male): Secondary | ICD-10-CM

## 2022-04-01 NOTE — Progress Notes (Signed)
[facility] Return Visit  SUBJECTIVE  History of Present Illness: Heather [name] is a 67 y.o. female seen in follow-up prolapse and incontinence. She underwent urodynamic testing.   Urodynamic Impression:  1. Sensation was normal; capacity was reduced 2. Stress Incontinence was demonstrated at normal pressures; 3. Detrusor Overactivity was demonstrated with leakage. 4. Emptying was normal with a normal PVR, a sustained detrusor contraction not present,  abdominal straining not present, normal urethral sphincter activity on EMG.  Also had a #5 gellhorn pessary placed last visit. She feels the pessary has been working well overall. She has felt something sliding over the last week but still feels supported. Using vitamin E for moisture.   Past Medical History: Patient  has a past medical history of Left spastic hemiparesis ([place]) (11/01/2018), Mitral valve prolapse, and Multiple sclerosis ([place]) (12/07/2018).   Past Surgical History: She  has a past surgical history that includes Tubal ligation; Gallbladder surgery; and Finger surgery.   Medications: She has a current medication list which includes the following prescription(s): ascorbic acid, baclofen, vitamin d, ibuprofen, multivitamin, and ocrevus.   Allergies: Patient is allergic to erythromycin and penicillins.   Social History: Patient  reports that she has never smoked. She has never used smokeless tobacco. She reports that she does not drink alcohol and does not use drugs.      OBJECTIVE     Physical Exam: Vitals:   04/01/22 0956  BP: 118/78   Gen: No apparent distress, A&O x 3.  Detailed Urogynecologic Evaluation:  Normal external genitalia. Pessary noted to be in place. Bladder dropped to about 0 with pessary in. Pessary removed and cleaned. Fitted with a #4 cube pessary. It fit well and was comfortable with standing, bending and walking. Fitter removed and pessary replaced to go home with. Patient  demonstrated proper placement and removal of pessary.   Prior exam showed:  POP-Q (07/30/21):    POP-Q   3                                            Aa   7                                           Ba   7                                              C    6.5                                            Gh   3.5                                            Pb   7  tvl    3                                            Ap   7                                            Bp   -4                                                   ASSESSMENT AND PLAN    Heather [name] is a 67 y.o. with:  1. Uterovaginal prolapse, complete   2. Prolapse of anterior vaginal wall   3. Prolapse of posterior vaginal wall   4. SUI (stress urinary incontinence, female)    - New #4 cube pessary replaced. Advised to try to remove it weekly and clean with gentle soap and water. She will return in 3 months for a pessary checked.  - Reviewed results of urodynamic testing. Demonstrated SUI and emptied bladder well.  - Timing of surgery is scheduled 3-4 months after Ocrevus infusion for MS per Neurology. - Currently has surgery scheduled 08/26/22: Exam under anesthesia, robotic assisted total laparoscopic hysterectomy , bilateral salpingo-oophorectomy, Sacrocolpopexy, possible posterior repair and perineorrhaphy, midurethral sling, cystoscopy . She will return for a pre op visit.   Heather Folds, MD   Time spent: I spent 15 minutes dedicated to the care of this patient on the date of this encounter to include pre-visit review of records, face-to-face time with the patient and post visit documentation. Additional time was spent for pessary fitting.

## 2022-05-20 ENCOUNTER — Telehealth: Payer: Self-pay | Admitting: Diagnostic Neuroimaging

## 2022-05-20 MED ORDER — BACLOFEN 10 MG PO TABS: 5.0000 mg | ORAL_TABLET | Freq: Two times a day (BID) | ORAL | 0 refills | Status: DC

## 2022-05-20 NOTE — Telephone Encounter (Signed)
Pt called stating that she has changed pharmacies and is needing her baclofen (LIORESAL) 10 MG tablet Rx sent to [facility] to be filled.

## 2022-05-20 NOTE — Telephone Encounter (Signed)
Refill sent for the patient to the pharmacy as mentioned.

## 2022-06-26 ENCOUNTER — Telehealth: Payer: Self-pay | Admitting: Neurology

## 2022-06-26 NOTE — Telephone Encounter (Signed)
Received a notification that pt will continue to receive free drug until 01/13/2023 through [employer]

## 2022-07-01 NOTE — Telephone Encounter (Signed)
[employer] ([place]) due to change in neurologist prescriber foundation form needs to be filled out and faxed back for Ocrevus. Will fax form to your office to [phone]. Contact us at [phone] option 4

## 2022-07-03 ENCOUNTER — Encounter: Payer: Self-pay | Admitting: Obstetrics and Gynecology

## 2022-07-03 ENCOUNTER — Ambulatory Visit (INDEPENDENT_AMBULATORY_CARE_PROVIDER_SITE_OTHER): Payer: PPO | Admitting: Obstetrics and Gynecology

## 2022-07-03 VITALS — BP 130/76 | HR 82

## 2022-07-03 DIAGNOSIS — N393 Stress incontinence (female) (male): Secondary | ICD-10-CM | POA: Diagnosis not present

## 2022-07-03 DIAGNOSIS — N816 Rectocele: Secondary | ICD-10-CM

## 2022-07-03 DIAGNOSIS — Z4689 Encounter for fitting and adjustment of other specified devices: Secondary | ICD-10-CM

## 2022-07-03 DIAGNOSIS — N813 Complete uterovaginal prolapse: Secondary | ICD-10-CM

## 2022-07-03 DIAGNOSIS — N8110 Cystocele, unspecified: Secondary | ICD-10-CM

## 2022-07-03 NOTE — Progress Notes (Signed)
[facility]   Subjective:     Chief Complaint:  Chief Complaint  Patient presents with   Pessary Check   History of Present Illness: Skylei Kendziorski is a 67 y.o. female with stage IV pelvic organ prolapse who presents for a pessary check. She is using a size #4 cube pessary. The pessary has been working well and she has no complaints. She is not using vaginal estrogen. She denies vaginal bleeding.  Past Medical History: Patient  has a past medical history of Left spastic hemiparesis ([facility]) (11/01/2018), Mitral valve prolapse, and Multiple sclerosis ([facility]) (12/07/2018).   Past Surgical History: She  has a past surgical history that includes Tubal ligation; Gallbladder surgery; and Finger surgery.   Medications: She has a current medication list which includes the following prescription(s): ascorbic acid, baclofen, vitamin d, ibuprofen, multivitamin, and ocrevus.   Allergies: Patient is allergic to erythromycin and penicillins.   Social History: Patient  reports that she has never smoked. She has never used smokeless tobacco. She reports that she does not drink alcohol and does not use drugs.      Objective:    Physical Exam: BP 130/76   Pulse 82  Gen: No apparent distress, A&O x 3. Detailed Urogynecologic Evaluation:  Pelvic Exam: Normal external female genitalia; Bartholin's and Skene's glands normal in appearance; urethral meatus normal in appearance, no urethral masses or discharge. The pessary was noted to be in place. It was removed and cleaned. Speculum exam revealed no lesions in the vagina. The pessary was replaced. It was comfortable to the patient and fit well.     Assessment/Plan:    Assessment: Ms. Manganelli is a 67 y.o. with stage IV pelvic organ prolapse here for a pessary check. She is doing well.  Plan: She will remove once a week . She will continue to use lubricant. She will follow-up for pre-op in July.

## 2022-07-10 NOTE — Telephone Encounter (Signed)
Called [employer]. We have still not received a form to be complete for the patient for the ocrevus. They were not open at the time. I LVM advising that we never received the [employer] form to complete. Informed them to fax this to our pod and atn to me. Once we receive will complete for the pt.

## 2022-07-15 NOTE — Telephone Encounter (Signed)
Form completed and placed on Dr [employer] desk to be signed.

## 2022-07-21 NOTE — Telephone Encounter (Signed)
Received a form from [place] stating the patient is approved for free drug

## 2022-07-24 ENCOUNTER — Other Ambulatory Visit: Payer: Self-pay

## 2022-07-24 DIAGNOSIS — G35 Multiple sclerosis: Secondary | ICD-10-CM

## 2022-07-29 ENCOUNTER — Encounter: Payer: Self-pay | Admitting: Neurology

## 2022-07-29 ENCOUNTER — Other Ambulatory Visit: Payer: Self-pay

## 2022-07-29 ENCOUNTER — Encounter (HOSPITAL_BASED_OUTPATIENT_CLINIC_OR_DEPARTMENT_OTHER): Payer: Self-pay | Admitting: Obstetrics and Gynecology

## 2022-07-29 ENCOUNTER — Ambulatory Visit: Payer: PPO | Admitting: Neurology

## 2022-07-29 VITALS — BP 124/73 | HR 60 | Ht 63.0 in | Wt 144.5 lb

## 2022-07-29 DIAGNOSIS — G8114 Spastic hemiplegia affecting left nondominant side: Secondary | ICD-10-CM

## 2022-07-29 DIAGNOSIS — G35 Multiple sclerosis: Secondary | ICD-10-CM

## 2022-07-29 DIAGNOSIS — Z01812 Encounter for preprocedural laboratory examination: Secondary | ICD-10-CM | POA: Diagnosis not present

## 2022-07-29 MED ORDER — BACLOFEN 10 MG PO TABS: 5.0000 mg | ORAL_TABLET | Freq: Two times a day (BID) | ORAL | 3 refills | Status: DC

## 2022-07-29 NOTE — Patient Instructions (Signed)
Great to see you today.  We will continue Ocrevus.  Check labs today.  We will push Ocrevus 6 to 8 weeks out from date of hysterectomy.

## 2022-07-29 NOTE — Progress Notes (Addendum)
Spoke w/ via phone for pre-op interview---Heather [name] needs dos---- none per anesthesia, surgeon orders pending              [name] results------08/21/22 [name] appt for cbc, type & screen, bmp COVID test -----patient states asymptomatic no test needed Arrive at -------0930 on 08/26/22 NPO after MN NO Solid Food.  Clear liquids from MN until---0830 Med rec completed Medications to take morning of surgery -----Baclofen Diabetic medication -----n/a Patient instructed no nail polish to be worn day of surgery Patient instructed to bring photo id and insurance card day of surgery Patient aware to have Driver (ride ) / caregiver    for 24 hours after surgery - husband, [name] or sister, [name] Patient Special Instructions -----Extended / overnight stay instructions given.Partial denture needs to be removed prior to OR. Pre-Op special Instructions -----Requested orders from Job Founds, NP via Epic IB on 07/29/22.  Patient ambulates with cane due to left-sided weakness from MS. Patient states she is able to transfer independently. Patient verbalized understanding of instructions that were given at this phone interview. Patient denies shortness of breath, chest pain, fever, cough at this phone interview.  Patient was diagnosed with MS in 2020. She follows w/ [facility], LOV 07/29/22 in Epic & Chart. According to 07/29/22 note by Buel Ream, NP, patient is overall stable.
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
## 2022-07-29 NOTE — Progress Notes (Signed)
Your procedure is scheduled on Tuesday, 08/26/2022.  Report to [facility] AT  9:30 AM.   Call this number if you have problems the morning of surgery  :[phone].   OUR ADDRESS IS [address].  [facility] LOCATED IN [facility].  PLEASE BRING YOUR INSURANCE CARD AND PHOTO ID DAY OF SURGERY.  ONLY 2 PEOPLE ARE ALLOWED IN  WAITING  ROOM                                      REMEMBER:  DO NOT EAT FOOD, CANDY GUM OR MINTS  AFTER MIDNIGHT THE NIGHT BEFORE YOUR SURGERY . YOU MAY HAVE CLEAR LIQUIDS FROM MIDNIGHT THE NIGHT BEFORE YOUR SURGERY UNTIL  8:30 AM. NO CLEAR LIQUIDS AFTER   8:30 AM DAY OF SURGERY.  YOU MAY  BRUSH YOUR TEETH MORNING OF SURGERY AND RINSE YOUR MOUTH OUT, NO CHEWING GUM CANDY OR MINTS.     CLEAR LIQUID DIET    Allowed      Water                                                                   Coffee and tea, regular and decaf  (NO cream or milk products of any type, may sweeten)                         Carbonated beverages, regular and diet                                    Sports drinks like Gatorade _____________________________________________________________________     TAKE ONLY THESE MEDICATIONS MORNING OF SURGERY: Baclofen                                        DO NOT WEAR JEWERLY/  METAL/  PIERCINGS (INCLUDING NO PLASTIC PIERCINGS) DO NOT WEAR LOTIONS, POWDERS, PERFUMES OR NAIL POLISH ON YOUR FINGERNAILS. TOENAIL POLISH IS OK TO WEAR. DO NOT SHAVE FOR 48 HOURS PRIOR TO DAY OF SURGERY.  CONTACTS, GLASSES, OR DENTURES MAY NOT BE WORN TO SURGERY.  REMEMBER: NO SMOKING, VAPING ,  DRUGS OR ALCOHOL FOR 24 HOURS BEFORE YOUR SURGERY.                                    [facility] IS NOT RESPONSIBLE  FOR ANY BELONGINGS.                                                                    Marland Kitchen           [facility] - Preparing for Surgery Before surgery,  you can play an important role.  Because skin is not sterile,  your skin needs to be as free of germs as possible.  You can reduce the number of germs on your skin by washing with CHG (chlorahexidine gluconate) soap before surgery.  CHG is an antiseptic cleaner which kills germs and bonds with the skin to continue killing germs even after washing. Please DO NOT use if you have an allergy to CHG or antibacterial soaps.  If your skin becomes reddened/irritated stop using the CHG and inform your nurse when you arrive at Short Stay. Do not shave (including legs and underarms) for at least 48 hours prior to the first CHG shower.  You may shave your face/neck. Please follow these instructions carefully:  1.  Shower with CHG Soap the night before surgery and the  morning of Surgery.  2.  If you choose to wash your hair, wash your hair first as usual with your  normal  shampoo.  3.  After you shampoo, rinse your hair and body thoroughly to remove the  shampoo.                                        4.  Use CHG as you would any other liquid soap.  You can apply chg directly  to the skin and wash , chg soap provided, night before and morning of your surgery.  5.  Apply the CHG Soap to your body ONLY FROM THE NECK DOWN.   Do not use on face/ open                           Wound or open sores. Avoid contact with eyes, ears mouth and genitals (private parts).                       Wash face,  Genitals (private parts) with your normal soap.             6.  Wash thoroughly, paying special attention to the area where your surgery  will be performed.  7.  Thoroughly rinse your body with warm water from the neck down.  8.  DO NOT shower/wash with your normal soap after using and rinsing off  the CHG Soap.             9.  Pat yourself dry with a clean towel.            10.  Wear clean pajamas.            11.  Place clean sheets on your bed the night of your first shower and do not  sleep with pets. Day of Surgery : Do not apply any lotions/ powders the morning of surgery.   Please wear clean clothes to the hospital/surgery center.  IF YOU HAVE ANY SKIN IRRITATION OR PROBLEMS WITH THE SURGICAL SOAP, PLEASE GET A BAR OF GOLD DIAL SOAP AND SHOWER THE NIGHT BEFORE YOUR SURGERY AND THE MORNING OF YOUR SURGERY. PLEASE LET THE NURSE KNOW MORNING OF YOUR SURGERY IF YOU HAD ANY PROBLEMS WITH THE SURGICAL SOAP.   YOUR SURGEON MAY HAVE REQUESTED EXTENDED RECOVERY TIME AFTER YOUR SURGERY. IT COULD BE A  JUST A FEW HOURS  UP TO AN OVERNIGHT STAY.  YOUR SURGEON SHOULD HAVE DISCUSSED THIS WITH YOU PRIOR TO YOUR SURGERY.  IN THE EVENT YOU NEED TO STAY OVERNIGHT PLEASE REFER TO THE FOLLOWING GUIDELINES. YOU MAY HAVE UP TO 4 VISITORS  MAY VISIT IN THE EXTENDED RECOVERY ROOM UNTIL 800 PM ONLY.  ONE  VISITOR AGE 68 AND OVER MAY SPEND THE NIGHT AND MUST BE IN EXTENDED RECOVERY ROOM NO LATER THAN 800 PM . YOUR DISCHARGE TIME AFTER YOU SPEND THE NIGHT IS 900 AM THE MORNING AFTER YOUR SURGERY. YOU MAY PACK A SMALL OVERNIGHT BAG WITH TOILETRIES FOR YOUR OVERNIGHT STAY IF YOU WISH.  REGARDLESS OF IF YOU STAY OVER NIGHT OR ARE DISCHARGED THE SAME DAY YOU WILL BE REQUIRED TO HAVE A RESPONSIBLE ADULT (18 YRS OLD OR OLDER) STAY WITH YOU FOR AT LEAST THE FIRST 24 HOURS  YOUR PRESCRIPTION MEDICATIONS WILL BE PROVIDED DURING YOUR HOSPITAL STAY.  ________________________________________________________________________                                                        QUESTIONS Mechele Claude PRE OP NURSE PHONE [phone].

## 2022-07-30 LAB — CBC WITH DIFFERENTIAL/PLATELET
Basophils Absolute: 0.1 10*3/uL (ref 0.0–0.2)
Basos: 1 %
EOS (ABSOLUTE): 0.2 10*3/uL (ref 0.0–0.4)
Eos: 4 %
Hematocrit: 40.7 % (ref 34.0–46.6)
Hemoglobin: 13.2 g/dL (ref 11.1–15.9)
Immature Grans (Abs): 0.1 10*3/uL (ref 0.0–0.1)
Immature Granulocytes: 1 %
Lymphocytes Absolute: 1.2 10*3/uL (ref 0.7–3.1)
Lymphs: 21 %
MCH: 30.6 pg (ref 26.6–33.0)
MCHC: 32.4 g/dL (ref 31.5–35.7)
MCV: 94 fL (ref 79–97)
Monocytes Absolute: 0.7 10*3/uL (ref 0.1–0.9)
Monocytes: 12 %
Neutrophils Absolute: 3.6 10*3/uL (ref 1.4–7.0)
Neutrophils: 61 %
Platelets: 270 10*3/uL (ref 150–450)
RBC: 4.31 x10E6/uL (ref 3.77–5.28)
RDW: 11.7 % (ref 11.7–15.4)
WBC: 5.8 10*3/uL (ref 3.4–10.8)

## 2022-07-30 LAB — IGG, IGA, IGM
IgA/Immunoglobulin A, Serum: 129 mg/dL (ref 87–352)
IgG (Immunoglobin G), Serum: 1026 mg/dL (ref 586–1602)
IgM (Immunoglobulin M), Srm: 40 mg/dL (ref 26–217)

## 2022-08-06 DIAGNOSIS — Z1231 Encounter for screening mammogram for malignant neoplasm of breast: Secondary | ICD-10-CM | POA: Diagnosis not present

## 2022-08-11 ENCOUNTER — Ambulatory Visit (INDEPENDENT_AMBULATORY_CARE_PROVIDER_SITE_OTHER): Payer: PPO | Admitting: Obstetrics and Gynecology

## 2022-08-11 ENCOUNTER — Encounter: Payer: Self-pay | Admitting: Obstetrics and Gynecology

## 2022-08-11 VITALS — BP 122/83 | HR 76

## 2022-08-11 DIAGNOSIS — N393 Stress incontinence (female) (male): Secondary | ICD-10-CM

## 2022-08-11 DIAGNOSIS — N812 Incomplete uterovaginal prolapse: Secondary | ICD-10-CM | POA: Diagnosis not present

## 2022-08-11 DIAGNOSIS — Z01818 Encounter for other preprocedural examination: Secondary | ICD-10-CM

## 2022-08-11 MED ORDER — IBUPROFEN 600 MG PO TABS: 600.0000 mg | ORAL_TABLET | Freq: Four times a day (QID) | ORAL | 0 refills | Status: DC | PRN

## 2022-08-11 MED ORDER — POLYETHYLENE GLYCOL 3350 17 GM/SCOOP PO POWD: 17.0000 g | Freq: Every day | ORAL | 0 refills | Status: DC

## 2022-08-11 MED ORDER — ACETAMINOPHEN 500 MG PO TABS: 500.0000 mg | ORAL_TABLET | Freq: Four times a day (QID) | ORAL | 0 refills | Status: DC | PRN

## 2022-08-11 MED ORDER — OXYCODONE HCL 5 MG PO TABS
5.0000 mg | ORAL_TABLET | ORAL | 0 refills | Status: DC | PRN
Start: 2022-08-11 — End: 2022-09-05

## 2022-08-11 MED ORDER — ESTRADIOL 0.1 MG/GM VA CREA
0.5000 g | TOPICAL_CREAM | VAGINAL | 11 refills | Status: AC
Start: 2022-08-11 — End: ?

## 2022-08-11 NOTE — H&P (Signed)
[facility] H&P  Subjective Chief Complaint: Heather [name] presents for a preoperative encounter.   History of Present Illness: Heather [name] is a 67 y.o. female who presents for preoperative visit.  She is scheduled to undergo Exam under anesthesia, Robotic assisted total hysterectomy with bilateral salpingo-oophorectomy and sacrocolpopexy, with possible posterior repair (rectocele and perineorrhaphy), Mid-Urethral sling, and cystoscopy on 08/26/22.  Her symptoms include pelvic organ prolapse and stress urinary incontinence, and she was was found to have Stage IV anterior, Stage IV posterior, Stage IV apical prolapse.   Urodynamics showed: 1. Sensation was normal; capacity was reduced 2. Stress Incontinence was demonstrated at normal pressures; 3. Detrusor Overactivity was demonstrated with leakage. 4. Emptying was normal with a normal PVR, a sustained detrusor contraction not present,  abdominal straining not present, normal urethral sphincter activity on EMG  Past Medical History:  Diagnosis Date   Ambulates with cane    Family history of adverse reaction to anesthesia    mother has N & V   Headache    from tmj joint   Left spastic hemiparesis (HCC) 11/01/2018   diagnosed w/ MS in 2020, left leg weak from MS   Mitral valve prolapse    no problems   Multiple sclerosis (HCC) 12/07/2018   Follows w/ [facility], Dr. Joycelyn Schmid. treated w/ Ocrevus infusions every 6 months.   Prolapse of female pelvic organs    Stage IV. Follows w/ Dr. Florian Buff. Surgery scheduled for 08/26/22.   Wears partial dentures    1 tooth     Past Surgical History:  Procedure Laterality Date   CHOLECYSTECTOMY  2013   laparascopic   COLONOSCOPY  2019   1 polyp   FINGER SURGERY  2017   right hand   TUBAL LIGATION  1997    is allergic to erythromycin and penicillins.   Family History  Problem Relation Age of Onset   Peripheral Artery Disease Mother    Heart  disease Father    Stroke Father     Social History   Tobacco Use   Smoking status: Never   Smokeless tobacco: Never  Vaping Use   Vaping status: Never Used  Substance Use Topics   Alcohol use: Never    Alcohol/week: 0.0 standard drinks of alcohol   Drug use: Never     Review of Systems was negative for a full 10 system review except as noted in the History of Present Illness.  No current facility-administered medications for this encounter.  Current Outpatient Medications:    ELDERBERRY PO, Take by mouth as needed. gummies, Disp: , Rfl:    acetaminophen (TYLENOL) 500 MG tablet, Take 1 tablet (500 mg total) by mouth every 6 (six) hours as needed (pain)., Disp: 30 tablet, Rfl: 0   ascorbic acid (VITAMIN C) 500 MG tablet, Take 1,000 mg by mouth daily., Disp: , Rfl:    baclofen (LIORESAL) 10 MG tablet, Take 0.5 tablets (5 mg total) by mouth 2 (two) times daily., Disp: 90 tablet, Rfl: 3   Cholecalciferol (VITAMIN D) 50 MCG (2000 UT) tablet, Take 2,000 Units by mouth daily., Disp: , Rfl:    estradiol (ESTRACE) 0.1 MG/GM vaginal cream, Place 0.5 g vaginally 2 (two) times a week. Place 0.5g nightly for two weeks then twice a week after, Disp: 42.5 g, Rfl: 11   Ibuprofen (ADVIL PO), Take by mouth as needed. , Disp: , Rfl:    ibuprofen (ADVIL) 600 MG tablet, Take 1 tablet (600 mg total) by  mouth every 6 (six) hours as needed., Disp: 30 tablet, Rfl: 0   Multiple Vitamin (MULTIVITAMIN) capsule, Take 1 capsule by mouth daily., Disp: , Rfl:    ocrelizumab (OCREVUS) 300 MG/10ML injection, Inject 600 mg into the vein. 300mg IV on day 1 &15. 600mg  IV q 6 months thereafter. [facility]., Disp: , Rfl:    oxyCODONE (OXY IR/ROXICODONE) 5 MG immediate release tablet, Take 1 tablet (5 mg total) by mouth every 4 (four) hours as needed for severe pain., Disp: 15 tablet, Rfl: 0   polyethylene glycol powder (GLYCOLAX/MIRALAX) 17 GM/SCOOP powder, Take 17 g by mouth daily. Drink 17g (1 scoop) dissolved in  water per day., Disp: 255 g, Rfl: 0   Objective There were no vitals filed for this visit.   Gen: NAD CV: S1 S2 RRR Lungs: Clear to auscultation bilaterally Abd: soft, nontender   Previous Pelvic Exam showed: Pelvic Exam: Normal external female genitalia; Bartholin's and Skene's glands normal in appearance; urethral meatus normal in appearance, no urethral masses or discharge.    CST: negative   Complete uterine procedentia noted.  Normal vaginal mucosa with atrophy. Cervix normal appearance. Uterus normal single, nontender. Adnexa no mass, fullness, tenderness.     Pelvic floor strength I/V   Pelvic floor musculature: Right levator non-tender, Right obturator non-tender, Left levator non-tender, Left obturator non-tender   POP-Q:    POP-Q   3                                            Aa   7                                           Ba   7                                              C    6.5                                            Gh   3.5                                            Pb   7                                            tvl    3                                            Ap   7  Bp   -4                                              D    Catheterized urine specimen was obtained, since first specimen contained blood but patient reported vaginal bleeding. 125cc clear urine drained. POC urine negative.    Rectal Exam:  Normal external rectum    Assessment/ Plan  Assessment: The patient is a 67 y.o. year old scheduled to undergo Exam under anesthesia, Robotic assisted total hysterectomy with bilateral salpingo-oophorectomy and sacrocolpopexy, with possible posterior repair (rectocele and perineorrhaphy), Mid-Urethral sling, and cystoscopy. Verbal consent was obtained for these procedures.

## 2022-08-11 NOTE — Patient Instructions (Signed)
You can go ahead and start Miralax prior to surgery.   Please start using estrogen cream nightly for two weeks (A blueberry sized amount onto the finger nightly placed into the vagina, do not use the applicator) and after surgery wait 7 days and start the estrogen cream again using it twice weekly for suture healing in the vagina.   You can start exercise about a week after surgery. Remember to be veery careful with the amount of weight you are carrying for the laundry as you said this is up and down stairs. Less than 10lbs the first 6 weeks.

## 2022-08-11 NOTE — Progress Notes (Signed)
[facility] Pre-Operative Exam  Subjective Chief Complaint: Heather [name] presents for a preoperative encounter.   History of Present Illness: Heather [name] is a 67 y.o. female who presents for preoperative visit.  She is scheduled to undergo Exam under anesthesia, Robotic assisted total hysterectomy with bilateral salpingo-oophorectomy and sacrocolpopexy, with possible posterior repair (rectocele and perineorrhaphy), Mid-Urethral sling, and cystoscopy on 08/26/22.  Her symptoms include pelvic organ prolapse and stress urinary incontinence, and she was was found to have Stage IV anterior, Stage IV posterior, Stage IV apical prolapse.   Urodynamics showed: 1. Sensation was normal; capacity was reduced 2. Stress Incontinence was demonstrated at normal pressures; 3. Detrusor Overactivity was demonstrated with leakage. 4. Emptying was normal with a normal PVR, a sustained detrusor contraction not present,  abdominal straining not present, normal urethral sphincter activity on EMG  Past Medical History:  Diagnosis Date   Ambulates with cane    Family history of adverse reaction to anesthesia    mother has N & V   Headache    from tmj joint   Left spastic hemiparesis (HCC) 11/01/2018   diagnosed w/ MS in 2020, left leg weak from MS   Mitral valve prolapse    no problems   Multiple sclerosis (HCC) 12/07/2018   Follows w/ [facility], Dr. Joycelyn Schmid. treated w/ Ocrevus infusions every 6 months.   Prolapse of female pelvic organs    Stage IV. Follows w/ Dr. Florian Buff. Surgery scheduled for 08/26/22.   Wears partial dentures    1 tooth     Past Surgical History:  Procedure Laterality Date   CHOLECYSTECTOMY  2013   laparascopic   COLONOSCOPY  2019   1 polyp   FINGER SURGERY  2017   right hand   TUBAL LIGATION  1997    is allergic to erythromycin and penicillins.   Family History  Problem Relation Age of Onset   Peripheral Artery Disease Mother     Heart disease Father    Stroke Father     Social History   Tobacco Use   Smoking status: Never   Smokeless tobacco: Never  Vaping Use   Vaping status: Never Used  Substance Use Topics   Alcohol use: Never    Alcohol/week: 0.0 standard drinks of alcohol   Drug use: Never     Review of Systems was negative for a full 10 system review except as noted in the History of Present Illness.   Current Outpatient Medications:    ascorbic acid (VITAMIN C) 500 MG tablet, Take 1,000 mg by mouth daily., Disp: , Rfl:    baclofen (LIORESAL) 10 MG tablet, Take 0.5 tablets (5 mg total) by mouth 2 (two) times daily., Disp: 90 tablet, Rfl: 3   Cholecalciferol (VITAMIN D) 50 MCG (2000 UT) tablet, Take 2,000 Units by mouth daily., Disp: , Rfl:    ELDERBERRY PO, Take by mouth as needed. gummies, Disp: , Rfl:    Ibuprofen (ADVIL PO), Take by mouth as needed. , Disp: , Rfl:    Multiple Vitamin (MULTIVITAMIN) capsule, Take 1 capsule by mouth daily., Disp: , Rfl:    ocrelizumab (OCREVUS) 300 MG/10ML injection, Inject 600 mg into the vein. 300mg IV on day 1 &15. 600mg  IV q 6 months thereafter. [facility]., Disp: , Rfl:    Objective Vitals:   08/11/22 1105  BP: 122/83  Pulse: 76    Gen: NAD CV: S1 S2 RRR Lungs: Clear to auscultation bilaterally Abd: soft, nontender   Previous  Pelvic Exam showed: Pelvic Exam: Normal external female genitalia; Bartholin's and Skene's glands normal in appearance; urethral meatus normal in appearance, no urethral masses or discharge.    CST: negative   Complete uterine procedentia noted.  Normal vaginal mucosa with atrophy. Cervix normal appearance. Uterus normal single, nontender. Adnexa no mass, fullness, tenderness.     Pelvic floor strength I/V   Pelvic floor musculature: Right levator non-tender, Right obturator non-tender, Left levator non-tender, Left obturator non-tender   POP-Q:    POP-Q   3                                            Aa    7                                           Ba   7                                              C    6.5                                            Gh   3.5                                            Pb   7                                            tvl    3                                            Ap   7                                            Bp   -4                                              D    Catheterized urine specimen was obtained, since first specimen contained blood but patient reported vaginal bleeding. 125cc clear urine drained. POC urine negative.    Rectal Exam:  Normal external rectum    Assessment/ Plan  Assessment: The patient is a 67 y.o. year old scheduled to undergo Exam under anesthesia, Robotic assisted total hysterectomy with bilateral salpingo-oophorectomy and sacrocolpopexy, with possible posterior repair (rectocele and perineorrhaphy), Mid-Urethral sling, and cystoscopy. Verbal consent was obtained for these procedures.  Plan:  General Surgical Consent: The patient has previously been counseled on alternative treatments, and the decision by the patient and provider was to proceed with the procedure listed above.  For all procedures, there are risks of bleeding, infection, damage to surrounding organs including but not limited to bowel, bladder, blood vessels, ureters and nerves, and need for further surgery if an injury were to occur. These risks are all low with minimally invasive surgery.   There are risks of numbness and weakness at any body site or buttock/rectal pain.  It is possible that baseline pain can be worsened by surgery, either with or without mesh. If surgery is vaginal, there is also a low risk of possible conversion to laparoscopy or open abdominal incision where indicated. Very rare risks include blood transfusion, blood clot, heart attack, pneumonia, or death.   There is also a risk of short-term postoperative urinary  retention with need to use a catheter. About half of patients need to go home from surgery with a catheter, which is then later removed in the office. The risk of long-term need for a catheter is very low. There is also a risk of worsening of overactive bladder.   Sling: The effectiveness of a midurethral vaginal mesh sling is approximately 85%, and thus, there will be times when you may leak urine after surgery, especially if your bladder is full or if you have a strong cough. There is a balance between making the sling tight enough to treat your leakage but not too tight so that you have long-term difficulty emptying your bladder. A mesh sling will not directly treat overactive bladder/urge incontinence and may worsen it.  There is an FDA safety notification on vaginal mesh procedures for prolapse but NOT mesh slings. We have extensive experience and training with mesh placement and we have close postoperative follow up to identify any potential complications from mesh. It is important to realize that this mesh is a permanent implant that cannot be easily removed. There are rare risks of mesh exposure (2-4%), pain with intercourse (0-7%), and infection (<1%). The risk of mesh exposure if more likely in a woman with risks for poor healing (prior radiation, poorly controlled diabetes, or immunocompromised). The risk of new or worsened chronic pain after mesh implant is more common in women with baseline chronic pain and/or poorly controlled anxiety or depression. Approximately 2-4% of patients will experience longer-term post-operative voiding dysfunction that may require surgical revision of the sling. We also reviewed that postoperatively, her stream may not be as strong as before surgery.   Prolapse (with or without mesh): Risk factors for surgical failure  include things that put pressure on your pelvis and the surgical repair, including obesity, chronic cough, and heavy lifting or straining (including  lifting children or adults, straining on the toilet, or lifting heavy objects such as furniture or anything weighing >25 lbs. Risks of recurrence is 20-30% with vaginal native tissue repair and a less than 10% with sacrocolpopexy with mesh.    Sacrocolpopexy: Mesh implants may provide more prolapse support, but do have some unique risks to consider. It is important to understand that mesh is permanent and cannot be easily removed. Risks of abdominal sacrocolpopexy mesh include mesh exposure (~3-6%), painful intercourse (recent studies show lower rates after surgery compared to before, with ~5-8% risk of new onset), and very rare risks of bowel or bladder injury or infection (<1%). The risk of mesh exposure is more likely in a woman with risks for poor healing (  prior radiation, poorly controlled diabetes, or immunocompromised). The risk of new or worsened chronic pain after mesh implant is more common in women with baseline chronic pain and/or poorly controlled anxiety or depression. There is an FDA safety notification on vaginal mesh procedures for prolapse but NOT abdominal mesh procedures and therefore does not apply to your surgery. We have extensive experience and training with mesh placement and we have close postoperative follow up to identify any potential complications from mesh.    We discussed consent for blood products. Risks for blood transfusion include allergic reactions, other reactions that can affect different body organs and managed accordingly, transmission of infectious diseases such as HIV or Hepatitis. However, the blood is screened. Patient consents for blood products.  Pre-operative instructions:  She was instructed to not take Aspirin/NSAIDs x 7days prior to surgery. Antibiotic prophylaxis was ordered as indicated.  Catheter use: Patient will go home with foley if needed after post-operative voiding trial.  Post-operative instructions:  She was provided with specific  post-operative instructions, including precautions and signs/symptoms for which we would recommend contacting us, in addition to daytime and after-hours contact phone numbers. This was provided on a handout.   Post-operative medications: Prescriptions for motrin, tylenol, miralax, and oxycodone were sent to her pharmacy. Discussed using ibuprofen and tylenol on a schedule to limit use of narcotics.   Laboratory testing:  We will check labs: CBC ordered.  Preoperative clearance:  She does not require surgical clearance.    Post-operative follow-up:  A post-operative appointment will be made for 6 weeks from the date of surgery. If she needs a post-operative nurse visit for a voiding trial, that will be set up after she leaves the hospital.    Patient will call the clinic or use MyChart should anything change or any new issues arise.   Selmer Dominion, NP

## 2022-08-19 DIAGNOSIS — M8580 Other specified disorders of bone density and structure, unspecified site: Secondary | ICD-10-CM | POA: Diagnosis not present

## 2022-08-19 DIAGNOSIS — F430 Acute stress reaction: Secondary | ICD-10-CM | POA: Diagnosis not present

## 2022-08-19 DIAGNOSIS — N393 Stress incontinence (female) (male): Secondary | ICD-10-CM | POA: Diagnosis not present

## 2022-08-19 DIAGNOSIS — Z6822 Body mass index (BMI) 22.0-22.9, adult: Secondary | ICD-10-CM | POA: Diagnosis not present

## 2022-08-19 DIAGNOSIS — G35 Multiple sclerosis: Secondary | ICD-10-CM | POA: Diagnosis not present

## 2022-08-19 DIAGNOSIS — N814 Uterovaginal prolapse, unspecified: Secondary | ICD-10-CM | POA: Diagnosis not present

## 2022-08-21 ENCOUNTER — Encounter (HOSPITAL_COMMUNITY)
Admission: RE | Admit: 2022-08-21 | Discharge: 2022-08-21 | Disposition: A | Discharge status: Home / Self Care | Payer: PPO | Source: Ambulatory Visit | Attending: Obstetrics and Gynecology | Admitting: Obstetrics and Gynecology

## 2022-08-21 DIAGNOSIS — Z01812 Encounter for preprocedural laboratory examination: Secondary | ICD-10-CM | POA: Diagnosis not present

## 2022-08-21 DIAGNOSIS — Z01818 Encounter for other preprocedural examination: Secondary | ICD-10-CM

## 2022-08-21 LAB — CBC
HCT: 40.9 % (ref 36.0–46.0)
Hemoglobin: 13.2 g/dL (ref 12.0–15.0)
MCH: 30.8 pg (ref 26.0–34.0)
MCHC: 32.3 g/dL (ref 30.0–36.0)
MCV: 95.6 fL (ref 80.0–100.0)
Platelets: 242 10*3/uL (ref 150–400)
RBC: 4.28 MIL/uL (ref 3.87–5.11)
RDW: 12.2 % (ref 11.5–15.5)
WBC: 5.4 10*3/uL (ref 4.0–10.5)
nRBC: 0 % (ref 0.0–0.2)

## 2022-08-21 LAB — BASIC METABOLIC PANEL
Anion gap: 8 (ref 5–15)
BUN: 11 mg/dL (ref 8–23)
CO2: 25 mmol/L (ref 22–32)
Calcium: 9.1 mg/dL (ref 8.9–10.3)
Chloride: 107 mmol/L (ref 98–111)
Creatinine, Ser: 0.7 mg/dL (ref 0.44–1.00)
GFR, Estimated: >60 mL/min (ref >60)
Glucose, Bld: 91 mg/dL (ref 70–99)
Potassium: 4.1 mmol/L (ref 3.5–5.1)
Sodium: 140 mmol/L (ref 135–145)

## 2022-08-21 LAB — TYPE AND SCREEN
ABO/RH(D): A NEG
Antibody Screen: NEGATIVE

## 2022-08-26 ENCOUNTER — Ambulatory Visit (HOSPITAL_BASED_OUTPATIENT_CLINIC_OR_DEPARTMENT_OTHER): Payer: PPO | Admitting: Certified Registered Nurse Anesthetist

## 2022-08-26 ENCOUNTER — Encounter (HOSPITAL_BASED_OUTPATIENT_CLINIC_OR_DEPARTMENT_OTHER): Payer: Self-pay | Admitting: Obstetrics and Gynecology

## 2022-08-26 ENCOUNTER — Ambulatory Visit (HOSPITAL_BASED_OUTPATIENT_CLINIC_OR_DEPARTMENT_OTHER)
Admission: RE | Admit: 2022-08-26 | Discharge: 2022-08-27 | Disposition: A | Discharge status: Home / Self Care | Payer: PPO | Attending: Obstetrics and Gynecology | Admitting: Obstetrics and Gynecology

## 2022-08-26 ENCOUNTER — Other Ambulatory Visit: Payer: Self-pay

## 2022-08-26 ENCOUNTER — Encounter (HOSPITAL_BASED_OUTPATIENT_CLINIC_OR_DEPARTMENT_OTHER)
Admission: RE | Disposition: A | Discharge status: Home / Self Care | Payer: Self-pay | Source: Home / Self Care | Attending: Obstetrics and Gynecology

## 2022-08-26 DIAGNOSIS — N813 Complete uterovaginal prolapse: Secondary | ICD-10-CM | POA: Diagnosis not present

## 2022-08-26 DIAGNOSIS — N8110 Cystocele, unspecified: Secondary | ICD-10-CM | POA: Diagnosis not present

## 2022-08-26 DIAGNOSIS — Z01818 Encounter for other preprocedural examination: Secondary | ICD-10-CM

## 2022-08-26 DIAGNOSIS — G35 Multiple sclerosis: Secondary | ICD-10-CM

## 2022-08-26 DIAGNOSIS — N72 Inflammatory disease of cervix uteri: Secondary | ICD-10-CM | POA: Diagnosis not present

## 2022-08-26 DIAGNOSIS — N393 Stress incontinence (female) (male): Secondary | ICD-10-CM | POA: Insufficient documentation

## 2022-08-26 DIAGNOSIS — N838 Other noninflammatory disorders of ovary, fallopian tube and broad ligament: Secondary | ICD-10-CM | POA: Insufficient documentation

## 2022-08-26 DIAGNOSIS — N888 Other specified noninflammatory disorders of cervix uteri: Secondary | ICD-10-CM | POA: Diagnosis not present

## 2022-08-26 DIAGNOSIS — K660 Peritoneal adhesions (postprocedural) (postinfection): Secondary | ICD-10-CM | POA: Diagnosis not present

## 2022-08-26 HISTORY — PX: RECTOCELE REPAIR: SHX761

## 2022-08-26 HISTORY — DX: Headache, unspecified: R51.9

## 2022-08-26 HISTORY — PX: XI ROBOTIC ASSISTED TOTAL HYSTERECTOMY WITH SACROCOLPOPEXY: SHX6825

## 2022-08-26 HISTORY — DX: Family history of other specified conditions: Z84.89

## 2022-08-26 HISTORY — PX: ROBOTIC ASSISTED LAPAROSCOPIC SACROCOLPOPEXY: SHX5388

## 2022-08-26 HISTORY — PX: BLADDER SUSPENSION: SHX72

## 2022-08-26 HISTORY — DX: Dependence on other enabling machines and devices: Z99.89

## 2022-08-26 HISTORY — PX: CYSTOSCOPY: SHX5120

## 2022-08-26 HISTORY — DX: Presence of dental prosthetic device (complete) (partial): Z97.2

## 2022-08-26 HISTORY — DX: Female genital prolapse, unspecified: N81.9

## 2022-08-26 LAB — CBC
HCT: 38.8 % (ref 36.0–46.0)
Hemoglobin: 12.9 g/dL (ref 12.0–15.0)
MCH: 31.5 pg (ref 26.0–34.0)
MCHC: 33.2 g/dL (ref 30.0–36.0)
MCV: 94.9 fL (ref 80.0–100.0)
Platelets: 228 10*3/uL (ref 150–400)
RBC: 4.09 MIL/uL (ref 3.87–5.11)
RDW: 12.1 % (ref 11.5–15.5)
WBC: 4.6 10*3/uL (ref 4.0–10.5)
nRBC: 0 % (ref 0.0–0.2)

## 2022-08-26 SURGERY — HYSTERECTOMY, TOTAL, ROBOT-ASSISTED, WITH SACROCOLPOPEXY
Anesthesia: General | Site: Vagina

## 2022-08-26 MED ORDER — HEMOSTATIC AGENTS (NO CHARGE) OPTIME
TOPICAL | Status: DC | PRN
  Administered 2022-08-26: 1

## 2022-08-26 MED ORDER — PROPOFOL 10 MG/ML IV BOLUS
INTRAVENOUS | Status: AC
  Filled 2022-08-26: qty 20

## 2022-08-26 MED ORDER — HYDROMORPHONE HCL 2 MG/ML IJ SOLN
INTRAMUSCULAR | Status: AC
  Filled 2022-08-26: qty 1

## 2022-08-26 MED ORDER — GABAPENTIN 300 MG PO CAPS
ORAL_CAPSULE | ORAL | Status: AC
  Filled 2022-08-26: qty 1

## 2022-08-26 MED ORDER — PHENYLEPHRINE 80 MCG/ML (10ML) SYRINGE FOR IV PUSH (FOR BLOOD PRESSURE SUPPORT)
PREFILLED_SYRINGE | INTRAVENOUS | Status: DC | PRN
  Administered 2022-08-26 (×5): 80 ug via INTRAVENOUS
  Administered 2022-08-26: 160 ug via INTRAVENOUS
  Administered 2022-08-26: 80 ug via INTRAVENOUS

## 2022-08-26 MED ORDER — GABAPENTIN 300 MG PO CAPS
300.0000 mg | ORAL_CAPSULE | ORAL | Status: AC
  Administered 2022-08-26: 300 mg via ORAL

## 2022-08-26 MED ORDER — STERILE WATER FOR IRRIGATION IR SOLN
Status: DC | PRN
  Administered 2022-08-26: 500 mL

## 2022-08-26 MED ORDER — WHITE PETROLATUM EX OINT
TOPICAL_OINTMENT | CUTANEOUS | Status: AC
  Filled 2022-08-26: qty 5

## 2022-08-26 MED ORDER — ACETAMINOPHEN 500 MG PO TABS
1000.0000 mg | ORAL_TABLET | ORAL | Status: AC
  Administered 2022-08-26: 1000 mg via ORAL

## 2022-08-26 MED ORDER — MIDAZOLAM HCL 2 MG/2ML IJ SOLN
INTRAMUSCULAR | Status: AC
  Filled 2022-08-26: qty 2

## 2022-08-26 MED ORDER — FENTANYL CITRATE (PF) 250 MCG/5ML IJ SOLN
INTRAMUSCULAR | Status: AC
  Filled 2022-08-26: qty 5

## 2022-08-26 MED ORDER — CEFAZOLIN SODIUM-DEXTROSE 2-4 GM/100ML-% IV SOLN
2.0000 g | INTRAVENOUS | Status: AC
  Administered 2022-08-26: 2 g via INTRAVENOUS

## 2022-08-26 MED ORDER — ROCURONIUM BROMIDE 10 MG/ML (PF) SYRINGE
PREFILLED_SYRINGE | INTRAVENOUS | Status: AC
  Filled 2022-08-26: qty 10

## 2022-08-26 MED ORDER — ONDANSETRON HCL 4 MG/2ML IJ SOLN: 4.0000 mg | Freq: Four times a day (QID) | INTRAMUSCULAR | Status: DC | PRN

## 2022-08-26 MED ORDER — PHENYLEPHRINE 80 MCG/ML (10ML) SYRINGE FOR IV PUSH (FOR BLOOD PRESSURE SUPPORT)
PREFILLED_SYRINGE | INTRAVENOUS | Status: AC
  Filled 2022-08-26: qty 20

## 2022-08-26 MED ORDER — ROCURONIUM BROMIDE 10 MG/ML (PF) SYRINGE
PREFILLED_SYRINGE | INTRAVENOUS | Status: DC | PRN
  Administered 2022-08-26: 20 mg via INTRAVENOUS
  Administered 2022-08-26: 70 mg via INTRAVENOUS
  Administered 2022-08-26: 10 mg via INTRAVENOUS

## 2022-08-26 MED ORDER — SIMETHICONE 80 MG PO CHEW: 80.0000 mg | CHEWABLE_TABLET | Freq: Four times a day (QID) | ORAL | Status: DC | PRN

## 2022-08-26 MED ORDER — LIDOCAINE 2% (20 MG/ML) 5 ML SYRINGE
INTRAMUSCULAR | Status: DC | PRN
  Administered 2022-08-26: 60 mg via INTRAVENOUS

## 2022-08-26 MED ORDER — OXYCODONE HCL 5 MG/5ML PO SOLN: 5.0000 mg | Freq: Once | ORAL | Status: DC | PRN

## 2022-08-26 MED ORDER — TRAMADOL HCL 50 MG PO TABS: 50.0000 mg | ORAL_TABLET | Freq: Four times a day (QID) | ORAL | Status: DC | PRN

## 2022-08-26 MED ORDER — PHENAZOPYRIDINE HCL 100 MG PO TABS
ORAL_TABLET | ORAL | Status: AC
  Filled 2022-08-26: qty 2

## 2022-08-26 MED ORDER — MIDAZOLAM HCL 2 MG/2ML IJ SOLN
INTRAMUSCULAR | Status: DC | PRN
  Administered 2022-08-26: 2 mg via INTRAVENOUS

## 2022-08-26 MED ORDER — HYDROMORPHONE HCL 1 MG/ML IJ SOLN
INTRAMUSCULAR | Status: DC | PRN
  Administered 2022-08-26 (×3): .5 mg via INTRAVENOUS

## 2022-08-26 MED ORDER — FENTANYL CITRATE (PF) 250 MCG/5ML IJ SOLN
INTRAMUSCULAR | Status: DC | PRN
  Administered 2022-08-26 (×2): 50 ug via INTRAVENOUS
  Administered 2022-08-26: 25 ug via INTRAVENOUS
  Administered 2022-08-26: 50 ug via INTRAVENOUS
  Administered 2022-08-26: 25 ug via INTRAVENOUS

## 2022-08-26 MED ORDER — PROPOFOL 500 MG/50ML IV EMUL
INTRAVENOUS | Status: AC
  Filled 2022-08-26: qty 50

## 2022-08-26 MED ORDER — DIPHENHYDRAMINE HCL 50 MG/ML IJ SOLN
INTRAMUSCULAR | Status: DC | PRN
  Administered 2022-08-26: 12.5 mg via INTRAVENOUS

## 2022-08-26 MED ORDER — ONDANSETRON HCL 4 MG/2ML IJ SOLN
INTRAMUSCULAR | Status: DC | PRN
  Administered 2022-08-26: 4 mg via INTRAVENOUS

## 2022-08-26 MED ORDER — OXYCODONE HCL 5 MG PO TABS: 5.0000 mg | ORAL_TABLET | Freq: Once | ORAL | Status: DC | PRN

## 2022-08-26 MED ORDER — PROPOFOL 10 MG/ML IV BOLUS
INTRAVENOUS | Status: DC | PRN
  Administered 2022-08-26: 140 mg via INTRAVENOUS

## 2022-08-26 MED ORDER — SODIUM CHLORIDE 0.9 % IR SOLN
Status: DC | PRN
  Administered 2022-08-26: 1000 mL
  Administered 2022-08-26: 200 mL

## 2022-08-26 MED ORDER — ACETAMINOPHEN 500 MG PO TABS
ORAL_TABLET | ORAL | Status: AC
  Filled 2022-08-26: qty 2

## 2022-08-26 MED ORDER — OXYCODONE HCL 5 MG PO TABS: 5.0000 mg | ORAL_TABLET | ORAL | Status: DC | PRN

## 2022-08-26 MED ORDER — PROPOFOL 500 MG/50ML IV EMUL
INTRAVENOUS | Status: DC | PRN
  Administered 2022-08-26: 25 ug/kg/min via INTRAVENOUS

## 2022-08-26 MED ORDER — LIDOCAINE-EPINEPHRINE 1 %-1:100000 IJ SOLN
INTRAMUSCULAR | Status: DC | PRN
  Administered 2022-08-26: 10 mL

## 2022-08-26 MED ORDER — POVIDONE-IODINE 10 % EX SWAB
2.0000 | Freq: Once | CUTANEOUS | Status: AC
  Administered 2022-08-26: 2 via TOPICAL

## 2022-08-26 MED ORDER — ONDANSETRON HCL 4 MG PO TABS: 4.0000 mg | ORAL_TABLET | Freq: Four times a day (QID) | ORAL | Status: DC | PRN

## 2022-08-26 MED ORDER — CEFAZOLIN SODIUM-DEXTROSE 2-4 GM/100ML-% IV SOLN
INTRAVENOUS | Status: AC
  Filled 2022-08-26: qty 100

## 2022-08-26 MED ORDER — PHENAZOPYRIDINE HCL 100 MG PO TABS
200.0000 mg | ORAL_TABLET | ORAL | Status: AC
  Administered 2022-08-26: 200 mg via ORAL

## 2022-08-26 MED ORDER — LACTATED RINGERS IV SOLN: INTRAVENOUS | Status: DC

## 2022-08-26 MED ORDER — FENTANYL CITRATE (PF) 100 MCG/2ML IJ SOLN
INTRAMUSCULAR | Status: AC
  Filled 2022-08-26: qty 2

## 2022-08-26 MED ORDER — IBUPROFEN 200 MG PO TABS
ORAL_TABLET | ORAL | Status: AC
  Filled 2022-08-26: qty 3

## 2022-08-26 MED ORDER — FENTANYL CITRATE (PF) 100 MCG/2ML IJ SOLN: 25.0000 ug | INTRAMUSCULAR | Status: DC | PRN

## 2022-08-26 MED ORDER — SUGAMMADEX SODIUM 200 MG/2ML IV SOLN
INTRAVENOUS | Status: DC | PRN
  Administered 2022-08-26: 150 mg via INTRAVENOUS

## 2022-08-26 MED ORDER — IBUPROFEN 200 MG PO TABS
600.0000 mg | ORAL_TABLET | Freq: Four times a day (QID) | ORAL | Status: DC
  Administered 2022-08-26 – 2022-08-27 (×3): 600 mg via ORAL

## 2022-08-26 MED ORDER — BUPIVACAINE HCL (PF) 0.25 % IJ SOLN
INTRAMUSCULAR | Status: DC | PRN
  Administered 2022-08-26: 21 mL

## 2022-08-26 MED ORDER — ONDANSETRON HCL 4 MG/2ML IJ SOLN: 4.0000 mg | Freq: Once | INTRAMUSCULAR | Status: DC | PRN

## 2022-08-26 MED ORDER — DEXAMETHASONE SODIUM PHOSPHATE 10 MG/ML IJ SOLN
INTRAMUSCULAR | Status: DC | PRN
  Administered 2022-08-26: 5 mg via INTRAVENOUS

## 2022-08-26 SURGICAL SUPPLY — 96 items
ADH SKN CLS APL DERMABOND .7 (GAUZE/BANDAGES/DRESSINGS) ×8
AGENT HMST KT MTR STRL THRMB (HEMOSTASIS) ×4
APL PRP STRL LF DISP 70% ISPRP (MISCELLANEOUS) ×4
BAG DRN RND TRDRP ANRFLXCHMBR (UROLOGICAL SUPPLIES) ×4
BAG URINE DRAIN 2000ML AR STRL (UROLOGICAL SUPPLIES) IMPLANT
BLADE CLIPPER SENSICLIP SURGIC (BLADE) ×4 IMPLANT
BLADE SURG 15 STRL LF DISP TIS (BLADE) ×4 IMPLANT
BLADE SURG 15 STRL SS (BLADE) ×4
CATH FOLEY 3WAY 5CC 16FR (CATHETERS) ×4 IMPLANT
CHLORAPREP W/TINT 26 (MISCELLANEOUS) ×4 IMPLANT
COVER BACK TABLE 60X90IN (DRAPES) ×4 IMPLANT
COVER TIP SHEARS 8 DVNC (MISCELLANEOUS) ×4 IMPLANT
DEFOGGER SCOPE WARMER CLEARIFY (MISCELLANEOUS) ×4 IMPLANT
DERMABOND ADVANCED .7 DNX12 (GAUZE/BANDAGES/DRESSINGS) ×4 IMPLANT
DEVICE CAPIO SLIM SINGLE (INSTRUMENTS) IMPLANT
DRAPE ARM DVNC X/XI (DISPOSABLE) ×16 IMPLANT
DRAPE COLUMN DVNC XI (DISPOSABLE) ×4 IMPLANT
DRAPE SHEET LG 3/4 BI-LAMINATE (DRAPES) ×4 IMPLANT
DRAPE SURG IRRIG POUCH 19X23 (DRAPES) ×4 IMPLANT
DRAPE UTILITY XL STRL (DRAPES) ×4 IMPLANT
DRIVER NDL LRG 8 DVNC XI (INSTRUMENTS) ×4 IMPLANT
DRIVER NDL MEGA SUTCUT DVNCXI (INSTRUMENTS) ×4 IMPLANT
DRIVER NDLE LRG 8 DVNC XI (INSTRUMENTS) ×4 IMPLANT
DRIVER NDLE MEGA SUTCUT DVNCXI (INSTRUMENTS) ×4 IMPLANT
ELECT REM PT RETURN 9FT ADLT (ELECTROSURGICAL) ×4
ELECTRODE REM PT RTRN 9FT ADLT (ELECTROSURGICAL) ×4 IMPLANT
FORCEPS BPLR 8 MD DVNC XI (FORCEP) ×4 IMPLANT
GAUZE 4X4 16PLY ~~LOC~~+RFID DBL (SPONGE) IMPLANT
GLOVE BIOGEL PI IND STRL 6.5 (GLOVE) ×16 IMPLANT
GLOVE BIOGEL PI IND STRL 7.0 (GLOVE) ×8 IMPLANT
GLOVE ECLIPSE 6.0 STRL STRAW (GLOVE) ×12 IMPLANT
GOWN STRL REUS W/TWL LRG LVL3 (GOWN DISPOSABLE) ×4 IMPLANT
GRASPER TIP-UP FEN DVNC XI (INSTRUMENTS) ×4 IMPLANT
HOLDER FOLEY CATH W/STRAP (MISCELLANEOUS) ×4 IMPLANT
IRRIG SUCT STRYKERFLOW 2 WTIP (MISCELLANEOUS) ×4
IRRIGATION SUCT STRKRFLW 2 WTP (MISCELLANEOUS) ×4 IMPLANT
IV NS 1000ML (IV SOLUTION) ×8
IV NS 1000ML BAXH (IV SOLUTION) IMPLANT
KIT PINK PAD W/HEAD ARE REST (MISCELLANEOUS) ×4
KIT PINK PAD W/HEAD ARM REST (MISCELLANEOUS) ×4 IMPLANT
KIT TURNOVER CYSTO (KITS) ×4 IMPLANT
LEGGING LITHOTOMY PAIR STRL (DRAPES) ×4 IMPLANT
MANIFOLD NEPTUNE II (INSTRUMENTS) ×4 IMPLANT
MANIPULATOR ADVINCU DEL 2.5 PL (MISCELLANEOUS) IMPLANT
MANIPULATOR ADVINCU DEL 3.0 PL (MISCELLANEOUS) IMPLANT
MANIPULATOR ADVINCU DEL 3.5 PL (MISCELLANEOUS) IMPLANT
MANIPULATOR ADVINCU DEL 4.0 PL (MISCELLANEOUS) IMPLANT
MESH VERTESSA LITE -Y 2X4X3 (Mesh General) IMPLANT
NDL HYPO 22X1.5 SAFETY MO (MISCELLANEOUS) ×4 IMPLANT
NDL INSUFFLATION 14GA 120MM (NEEDLE) ×4 IMPLANT
NDL MAYO 6 CRC TAPER PT (NEEDLE) IMPLANT
NEEDLE HYPO 22X1.5 SAFETY MO (MISCELLANEOUS) ×4 IMPLANT
NEEDLE INSUFFLATION 14GA 120MM (NEEDLE) ×4 IMPLANT
NEEDLE MAYO 6 CRC TAPER PT (NEEDLE) IMPLANT
NS IRRIG 1000ML POUR BTL (IV SOLUTION) ×4 IMPLANT
OBTURATOR OPTICAL STND 8 DVNC (TROCAR) ×4
OBTURATOR OPTICALSTD 8 DVNC (TROCAR) ×4 IMPLANT
PACK CYSTO (CUSTOM PROCEDURE TRAY) ×4 IMPLANT
PACK ROBOT WH (CUSTOM PROCEDURE TRAY) ×4 IMPLANT
PACK ROBOTIC GOWN (GOWN DISPOSABLE) ×4 IMPLANT
PACK VAGINAL WOMENS (CUSTOM PROCEDURE TRAY) ×4 IMPLANT
PAD OB MATERNITY 4.3X12.25 (PERSONAL CARE ITEMS) ×4 IMPLANT
PAD PREP 24X48 CUFFED NSTRL (MISCELLANEOUS) ×4 IMPLANT
POUCH LAPAROSCOPIC INSTRUMENT (MISCELLANEOUS) IMPLANT
PROTECTOR NERVE ULNAR (MISCELLANEOUS) ×4 IMPLANT
RETRACTOR LONE STAR DISPOSABLE (INSTRUMENTS) ×4 IMPLANT
RETRACTOR STAY HOOK 5MM (MISCELLANEOUS) ×4 IMPLANT
SCISSORS MNPLR CVD DVNC XI (INSTRUMENTS) ×4 IMPLANT
SCRUB CHG 4% DYNA-HEX 4OZ (MISCELLANEOUS) ×4 IMPLANT
SEAL UNIV 5-12 XI (MISCELLANEOUS) ×20 IMPLANT
SEALER VESSEL EXT DVNC XI (MISCELLANEOUS) IMPLANT
SET IRRIG Y TYPE TUR BLADDER L (SET/KITS/TRAYS/PACK) ×4 IMPLANT
SET TUBE SMOKE EVAC HIGH FLOW (TUBING) ×4 IMPLANT
SLEEVE SCD COMPRESS KNEE MED (STOCKING) ×4 IMPLANT
SPIKE FLUID TRANSFER (MISCELLANEOUS) ×8 IMPLANT
SUCTION TUBE FRAZIER 10FR DISP (SUCTIONS) ×4 IMPLANT
SURGIFLO W/THROMBIN 8M KIT (HEMOSTASIS) IMPLANT
SUT ABS MONO DBL WITH NDL 48IN (SUTURE) IMPLANT
SUT GORETEX NAB #0 THX26 36IN (SUTURE) ×4 IMPLANT
SUT MNCRL AB 4-0 PS2 18 (SUTURE) ×4 IMPLANT
SUT MON AB 2-0 SH 27 (SUTURE) ×4 IMPLANT
SUT V-LOC BARB 180 2/0GR9 GS23 (SUTURE) ×8
SUT VIC AB 0 CT1 27 (SUTURE) ×12
SUT VIC AB 0 CT1 27XBRD ANBCTR (SUTURE) IMPLANT
SUT VIC AB 0 CT1 27XBRD ANTBC (SUTURE) ×8 IMPLANT
SUT VIC AB 2-0 SH 27 (SUTURE) ×4
SUT VIC AB 2-0 SH 27XBRD (SUTURE) ×4 IMPLANT
SUT VICRYL 2-0 SH 8X27 (SUTURE) ×4 IMPLANT
SUT VLOC 180 0 9IN GS21 (SUTURE) IMPLANT
SUT VLOC 180 2-0 9IN GS21 (SUTURE) IMPLANT
SUTURE V-LC BRB 180 2/0GR9GS23 (SUTURE) ×8 IMPLANT
SYR BULB EAR ULCER 3OZ GRN STR (SYRINGE) ×4 IMPLANT
SYS SLING ADV FIT BLUE TRNSVAG (Sling) IMPLANT
TOWEL OR 17X24 6PK STRL BLUE (TOWEL DISPOSABLE) ×4 IMPLANT
TRAY FOLEY W/BAG SLVR 14FR LF (SET/KITS/TRAYS/PACK) ×4 IMPLANT
WATER STERILE IRR 500ML POUR (IV SOLUTION) IMPLANT

## 2022-08-26 NOTE — Discharge Instructions (Signed)

## 2022-08-26 NOTE — Anesthesia Procedure Notes (Signed)
Procedure Name: Intubation Date/Time: 08/26/2022 12:36 PM  Performed by: Dairl Ponder, CRNAPre-anesthesia Checklist: Patient identified, Emergency Drugs available, Suction available and Patient being monitored Patient Re-evaluated:Patient Re-evaluated prior to induction Oxygen Delivery Method: Circle System Utilized Preoxygenation: Pre-oxygenation with 100% oxygen Induction Type: IV induction Ventilation: Mask ventilation without difficulty Laryngoscope Size: Mac and 3 Grade View: Grade I Tube type: Oral Tube size: 7.0 mm Number of attempts: 1 Airway Equipment and Method: Stylet and Oral airway Placement Confirmation: ETT inserted through vocal cords under direct vision, positive ETCO2 and breath sounds checked- equal and bilateral Secured at: 21 cm Tube secured with: Tape Dental Injury: Teeth and Oropharynx as per pre-operative assessment

## 2022-08-26 NOTE — Op Note (Signed)
Operative Note  Preoperative Diagnosis: anterior vaginal prolapse, posterior vaginal prolapse, uterovaginal prolapse, complete, and stress urinary incontinence  Postoperative Diagnosis: same  Procedures performed:  Robotic assisted total laparoscopic hysterectomy, bilateral salpingectomy, sacrocolpopexy (vertessa Lite Y), midurethral sling (Advantage Fit), cystoscopy, perineorrhaphy  Implants:  Implant Name Type Inv. Item Serial No. Manufacturer Lot No. LRB No. Used Action  MESH [employer]ond [id number] [employer]esh [employer] MESH [employer]land [employer]ond [phone]  [facility] MEDICAL [id number] N/A 1 Implanted  SYS SLING ADV FIT BLUE TRNSVAG - [id number] Sling SYS SLING ADV FIT BLUE TRNSVAG  [employer] [id number] N/A 1 Implanted    Attending Surgeon: Lanetta Inch, MD  Anesthesia: General endotracheal  Findings: 1. On vaginal exam, Stage IV prolapse  2. On laparoscopy, normal appearing uterus, bilateral fallopian tubes and ovaries. Filmy adhesions of large bowel to the pelvis  3. On  cystoscopy, normal bladder and urethra without injury, lesion or foreign body. Brisk bilateral ureteral efflux noted   Specimens:  ID Type Source Tests Collected by Time Destination  1 : cervix, uterus, bilateral fallopian tubes and ovaries Tissue PATH Gyn benign resection SURGICAL PATHOLOGY Marguerita Beards, MD 08/26/2022 1336     Estimated blood loss: 200 mL  IV fluids: 1000 mL  Urine output: see flowsheet  Complications: none  Procedure in Detail:  After informed consent was obtained, the patient was taken to the operating room, where general anesthesia was induced and found to be adequate. She was placed in dorsolithotomy position in yellowfin stirrups. Her hips were noted not to be hyperflexed or hyperextended. Her arms were padded with gel pads and tucked to her sides. Her hands were surrounded by foam. A padded strap was placed across her chest with foam between the pad and her skin. She was noted  to be appropriately positioned with all pressure points well padded and off tension. A tilt test showed no slippage. She was prepped and draped in the usual sterile fashion. A uterine manipulator was placed in the uterus after sounding to 8 cm, an appropriately sized Koh ring was placed around the cervix, and a pneumo-occluder balloon was positioned in the vagina for later use.  A sterile Foley catheter was inserted.   0.25% plain Marcaine was injected in the supraumbilical  area and an 8 mm supraumbilical skin incision was made with the scalpel.  A Veress needle was inserted into the incision, CO2 insufflation was started, a low opening pressure was noted, and pneumoperitoneum was obtained. The Veress needle was removed and a 8mm robotic trocar was placed. The robotic camera was inserted and intraperitoneal placement was confirmed. Survey of the abdomen and pelvis revealed the findings as noted above. The sacrum appeared to be free of any adhesive disease. After determining placement for the other ports, Local anesthetic was injected at each site and two 8 mm incisions were made for robotic ports at 10 cm lateral to and at the level of the umbilical port. Two additional 8 mm incisions were made 10 cm lateral to these and 30 degrees down followed by 8 mm robotic ports - the right side for an assistant port. All trocars were placed sequentially under direct visualization of the camera. The patient was placed in Trendelenburg. The robot was docked on the patient's left side. Monopolar endoshears alternating with the vessel sealer were placed in the right arm, a [place] bipolar grasper was placed in the 2nd arm of the patient's left side, and a Tip up grasper was placed in the  3rd arm on the patient's left side.   Attention was then turned to the robotic hysterectomy and BSO. The right round ligament was grasped, cauterized, and transected with electrocautery. The ureter was identified and was found to be well  away from the planned site of incision. Using the monopolar scissors, a window was made on the posterior leaf of the broad ligament. The infundibulopelvic ligament was cauterized and transected. The anterior and posterior leaves of the broad ligament were taken down with cautery and sharp dissection. The uterine artery was skeletonized and the bladder flap was created on the right side with a combination of electrosurgery and sharp dissection. The KOH ring was identified. The right uterine artery was clamped, cauterized, and transected. In a similar fashion, the left side was taken down. Further sharp dissection with combination of cautery was performed to further develop the bladder flap. At this point, the KOH ring was completely hugging the cervix. The pneumo-occluder balloon in the vagina was inflated to maintain pneumoperitoneum. A colpotomy was performed with electrosurgical cutting current and the uterus and cervix were completely amputated from the vagina. The specimen was delivered through the vagina. The posterior portion of the vaginal cuff was then grasped and pulled up to maintain pneumoperitoneum. The pneumo-occluder balloon was then replaced in the vagina. The right hand instrument was changed to a suture-cut needle driver. The vaginal cuff was then closed using a 0 V-lock suture in two layers.    The right hand instrument was replaced with monopolar endoshears. With a lucite probe in the vagina, the anterior vaginal dissection was then performed with sharp dissection and electrosurgery. The posterior vaginal dissection was then performed with sharp dissection and electrosurgery in order to dissect the rectum away from the posterior vagina. Attention was then turned to the sacral promontory. The peritoneum overlying the sacral promontory was tented up, dissected sharply with monopolar scissors and electrosurgery using layer by layer technique. The peritoneal incision was extended down to the  posterior cul-de-sac. This was performed with care to avoid the ureter on the right side and the sigmoid colon and its mesentary on the left side.  A "Y" mesh was then inserted into the abdomen after trimming to appropriate size. With the probe in the vagina, the anterior leaf of the Y mesh was affixed to the anterior portion of the vagina using a 2-0 v-loc suture in a spiral pattern to distribute the suture evenly across the surface of the anterior mesh leaf. In a similar fashion, the posterior leaf of the Y mesh was attached to the posterior surface of the vagina with 2-0 v-loc suture.   The distal end of the mesh was then brought to overlie the sacrum. The correct amount of tension was determined in order to elevate the vagina, but not put the mesh under tension. The distal end of the mesh was then affixed to the anterior longitudinal sacral ligament using two interrupted transverse stitches of CV2 Gortex. The excess distal mesh was then cut and removed. The peritoneum was reapproximated over the mesh using 2-0 monocryl. The bladder flap was incorporated to completely retroperitonealize the mesh. All pedicles were carefully inspected and noted to be hemostatic as the CO2 gas was deflated. All instruments were removed from the patient's abdomen.   The Foley catheter was removed.  A 70-degree cystoscope was introduced, and 360-degree inspection revealed no injury, lesion or foreign body in the bladder. Brisk bilateral ureteral efflux was noted with the assistance of pyridium.  The bladder  was drained and the cystoscope was removed.  The Foley catheter was replaced.  The robot was undocked. The CO2 gas was removed and the ports were removed.  The skin incisions were closed with subcutaneous stitches of 4-0 Monocryl and covered with skin glue.    Attention was turned to the sling. A lonestar self-retraining retractor was placed with 4 stay hooks. The mid urethral area was located on the anterior vaginal wall.   Two Allis clamps were placed at the level of the midurethra. 1% lidocaine with epinephrine was injected into the vaginal mucosa. A vertical incision was made between the two clamps using a 15-blade scalpel.  Using sharp dissection, Metzenbaum scissors were used to make a periurethral tunnel from the vaginal incision towards the pubic rami bilaterally for the future sling tracts. The bladder was ensured to be empty. The trocar and attached sling were introduced into the right side of the periurethral vaginal incision, just inferior to the pubic symphysis on the right side. The trocar was guided through the endopelvic fascia and directly vertically.  While hugging the cephalad surface of the pubic bone, the trocar was guided out through the abdomen 2 fingerbreadths lateral to midline at the level of the pubic symphysis on the ipsilateral side. The trocar was placed on the left side in a similar fashion.  A 70-degree cystoscope was introduced, and 360-degree inspection revealed no trauma or trocars in the bladder, with brisk bilateral ureteral efflux.  The bladder was drained and the cystoscope was removed.  The Foley catheter was reinserted.  The sling was brought to lie beneath the mid-urethra.  A needle driver was placed behind the sling to ensure no tension.   The plastic sheath was removed from the sling and the distal ends of the sling were trimmed just below the level of the skin incisions.  Tension-free positioning of the sling was confirmed. Surgiflo was placed into the incision and hemostasis was noted.  The vaginal mucosal edges were reapproximated using 2-0 Vicryl.  A small vaginotomy was noted in the left sulcus and this was closed with a 2-0 vicryl figure of eight. The suprapubic sling incisions were closed with Dermabond.  Attention was then turned to the perineum. Two allis clamps were placed at the introitus. The perineum was injected with 1% lidocaine with epinephrine. A diamond shaped incision was  made over the perineum and excess skin was removed. Dissection was performed with Metzenbaum scissors to separate the mucosa from the underlying tissue. The perineal body was then reapproximated with two interrupted 0-vicryl sutures. The perineal skin was then closed with a 2-0 vicryl in a subcutaneous and subcuticular fashion. Irrigation was performed and good hemostasis was noted. Sponge, lap, and needle counts were correct x 2. The patient tolerated the procedure well. She was awakened from anesthesia and transferred to the recovery room in stable condition.   Marguerita Beards, MD

## 2022-08-26 NOTE — Anesthesia Preprocedure Evaluation (Addendum)
Anesthesia Evaluation  Patient identified by MRN, date of birth, ID band Patient awake    Reviewed: Allergy & Precautions, NPO status , Patient's Chart, lab work & pertinent test results  History of Anesthesia Complications Negative for: history of anesthetic complications  Airway Mallampati: II  TM Distance: >3 FB Neck ROM: Full    Dental  (+) Dental Advisory Given, Partial Upper   Pulmonary neg pulmonary ROS   Pulmonary exam normal        Cardiovascular negative cardio ROS Normal cardiovascular exam     Neuro/Psych  Headaches  Multiple sclerosis with left spastic hemiparesis   Neuromuscular disease  negative psych ROS   GI/Hepatic negative GI ROS, Neg liver ROS,,,  Endo/Other  negative endocrine ROS    Renal/GU negative Renal ROS     Musculoskeletal negative musculoskeletal ROS (+)    Abdominal   Peds  Hematology negative hematology ROS (+)   Anesthesia Other Findings   Reproductive/Obstetrics                             Anesthesia Physical Anesthesia Plan  ASA: 3  Anesthesia Plan: General   Post-op Pain Management: Tylenol PO (pre-op)* and Dilaudid IV   Induction: Intravenous  PONV Risk Score and Plan: 3 and Treatment may vary due to age or medical condition, Ondansetron, Dexamethasone and Propofol infusion  Airway Management Planned: Oral ETT  Additional Equipment: None  Intra-op Plan:   Post-operative Plan: Extubation in OR  Informed Consent: I have reviewed the patients History and Physical, chart, labs and discussed the procedure including the risks, benefits and alternatives for the proposed anesthesia with the patient or authorized representative who has indicated his/her understanding and acceptance.     Dental advisory given  Plan Discussed with: CRNA and Anesthesiologist  Anesthesia Plan Comments:         Anesthesia Quick Evaluation

## 2022-08-26 NOTE — Interval H&P Note (Signed)
History and Physical Interval Note:  08/26/2022 11:38 AM  Heather [name]  has presented today for surgery, with the diagnosis of anterior vaginal prolapse; posterior vaginal prolapse; complete uterovaginal prolapse; stress urinary incontinence.  The various methods of treatment have been discussed with the patient and family. After consideration of risks, benefits and other options for treatment, the patient has consented to  Procedure(s) with comments: XI ROBOTIC ASSISTED TOTAL HYSTERECTOMY WITH BILATERAL SALPINGO-OOPHORECTOMY AND SACROCOLPOPEXY (N/A)  POSSIBLE POSTERIOR REPAIR (RECTOCELE) AND PERINEORRHAPHY (N/A) TRANSVAGINAL TAPE (TVT) PROCEDURE (N/A) CYSTOSCOPY (N/A) as a surgical intervention.  The patient's history has been reviewed, patient examined, no change in status, stable for surgery.  I have reviewed the patient's chart and labs.  Questions were answered to the patient's satisfaction.     Marguerita Beards

## 2022-08-26 NOTE — Transfer of Care (Signed)
Immediate Anesthesia Transfer of Care Note  Patient: Heather [name]  Procedure(s) Performed: XI ROBOTIC ASSISTED TOTAL HYSTERECTOMY WITH BILATERAL SALPINGO-OOPHORECTOMY (Abdomen) PERINEORRHAPHY (Vagina ) TRANSVAGINAL TAPE (TVT) PROCEDURE (Vagina ) CYSTOSCOPY (Bladder) XI ROBOTIC ASSISTED LAPAROSCOPIC SACROCOLPOPEXY (Abdomen)  Patient Location: PACU  Anesthesia Type:General  Level of Consciousness: drowsy  Airway & Oxygen Therapy: Patient Spontanous Breathing and Patient connected to face mask oxygen  Post-op Assessment: Report given to RN and Post -op Vital signs reviewed and stable  Post vital signs: Reviewed and stable  Last Vitals:  Vitals Value Taken Time  BP 114/71 08/26/22 1652  Temp    Pulse 80 08/26/22 1653  Resp    SpO2 100 % 08/26/22 1653  Vitals shown include unfiled device data.  Last Pain:  Vitals:   08/26/22 1652  TempSrc:   PainSc: Asleep      Patients Stated Pain Goal: 5 (08/26/22 0955)  Complications: No notable events documented.

## 2022-08-27 DIAGNOSIS — N813 Complete uterovaginal prolapse: Secondary | ICD-10-CM | POA: Diagnosis not present

## 2022-08-27 MED ORDER — IBUPROFEN 200 MG PO TABS
ORAL_TABLET | ORAL | Status: AC
  Filled 2022-08-27: qty 3

## 2022-08-27 NOTE — Discharge Summary (Signed)
Physician Discharge Summary  Patient ID: Heather [name] MRN: [id number] DOB/AGE: 1955-12-11 67 y.o.  Admit date: 08/26/2022 Discharge date: 08/27/2022  Admission Diagnoses:  Discharge Diagnoses:  Principal Problem:   Uterovaginal prolapse, complete   Discharged Condition: good  Hospital Course: Admitted for surgery on 8/13: Robotic assisted total laparoscopic hysterectomy, bilateral salpingectomy, sacrocolpopexy (vertessa Lite Y), midurethral sling (Advantage Fit), cystoscopy, perineorrhaphy. Post operatively, pain was controlled on oral pain medications, she tolerated regular diet and ambulated. Passed her voiding trial and catheter was removed prior to discharge.   Consults: None  Significant Diagnostic Studies: none  Treatments: surgery: see above  Discharge Exam: Blood pressure (!) 104/58, pulse 87, temperature 98.5 F (36.9 C), resp. rate 18, height 5\' 3"  (1.6 m), weight 65.4 kg, SpO2 97%. General appearance: alert and cooperative GI: soft, non-tender; bowel sounds normal; no masses,  no organomegaly Extremities: extremities normal, atraumatic, no cyanosis or edema  Disposition: Discharge disposition: 01-Home or Self Care       Discharge Instructions     Call MD for:  persistant nausea and vomiting   Complete by: As directed    Call MD for:  redness, tenderness, or signs of infection (pain, swelling, redness, odor or green/yellow discharge around incision site)   Complete by: As directed    Call MD for:  severe uncontrolled pain   Complete by: As directed    Call MD for:  temperature >100.4   Complete by: As directed    Diet general   Complete by: As directed    Increase activity slowly   Complete by: As directed    May walk up steps   Complete by: As directed       Allergies as of 08/27/2022       Reactions   Erythromycin Rash   Penicillins Rash        Medication List     TAKE these medications    acetaminophen 500 MG tablet Commonly known as:  TYLENOL Take 1 tablet (500 mg total) by mouth every 6 (six) hours as needed (pain).   ADVIL PO Take by mouth as needed.   ibuprofen 600 MG tablet Commonly known as: ADVIL Take 1 tablet (600 mg total) by mouth every 6 (six) hours as needed. Notes to patient: May take next dose at 12 noon   ascorbic acid 500 MG tablet Commonly known as: VITAMIN C Take 1,000 mg by mouth daily.   baclofen 10 MG tablet Commonly known as: LIORESAL Take 0.5 tablets (5 mg total) by mouth 2 (two) times daily.   ELDERBERRY PO Take by mouth as needed. gummies   estradiol 0.1 MG/GM vaginal cream Commonly known as: ESTRACE Place 0.5 g vaginally 2 (two) times a week. Place 0.5g nightly for two weeks then twice a week after   multivitamin capsule Take 1 capsule by mouth daily.   Ocrevus 300 MG/10ML injection Generic drug: ocrelizumab Inject 600 mg into the vein. 300mg IV on day 1 &15. 600mg  IV q 6 months thereafter. [facility].   oxyCODONE 5 MG immediate release tablet Commonly known as: Oxy IR/ROXICODONE Take 1 tablet (5 mg total) by mouth every 4 (four) hours as needed for severe pain.   polyethylene glycol powder 17 GM/SCOOP powder Commonly known as: GLYCOLAX/MIRALAX Take 17 g by mouth daily. Drink 17g (1 scoop) dissolved in water per day.   Vitamin D 50 MCG (2000 UT) tablet Take 2,000 Units by mouth daily.         Signed: Marguerita Beards  08/27/2022, 8:50 AM

## 2022-08-27 NOTE — Progress Notes (Signed)
1 Day Post-Op Procedure(s) (LRB): XI ROBOTIC ASSISTED TOTAL HYSTERECTOMY WITH BILATERAL SALPINGO-OOPHORECTOMY (N/A) PERINEORRHAPHY (N/A) TRANSVAGINAL TAPE (TVT) PROCEDURE (N/A) CYSTOSCOPY (N/A) XI ROBOTIC ASSISTED LAPAROSCOPIC SACROCOLPOPEXY  Subjective: She is feeling well this morning. Pain has been well controlled. She passed her voiding trial- voided after backfill. She has ambulated. Tolerating regular diet. Minimal vaginal bleeding present.   Objective: Vitals:   08/27/22 0215 08/27/22 0600  BP: 103/60 (!) 104/58  Pulse: 96 87  Resp: 18 18  Temp: 98.3 F (36.8 C) 98.5 F (36.9 C)  SpO2: 96% 97%     General: alert and cooperative GI: soft, non-tender; bowel sounds normal; no masses,  no organomegaly and laparoscopic incisions: clean, dry, and intact Extremities: extremities normal, atraumatic, no cyanosis or edema  Assessment: s/p Procedure(s): XI ROBOTIC ASSISTED TOTAL HYSTERECTOMY WITH BILATERAL SALPINGO-OOPHORECTOMY (N/A) PERINEORRHAPHY (N/A) TRANSVAGINAL TAPE (TVT) PROCEDURE (N/A) CYSTOSCOPY (N/A) XI ROBOTIC ASSISTED LAPAROSCOPIC SACROCOLPOPEXY: stable  Plan: - Advance diet - Encourage ambulation - Continue PO medication- tramadol, ibuprofen and tylenol prn Discharge home   LOS: 0 days    Marguerita Beards, MD 08/27/2022, 8:39 AM

## 2022-08-27 NOTE — Progress Notes (Signed)
Filled bladder with 300 cc of sterile water. Patient tolerated instillation well and was then abe to void 350 cc of urine. Will continue to monitor patient during shift.

## 2022-08-27 NOTE — Anesthesia Postprocedure Evaluation (Signed)
Anesthesia Post Note  Patient: Zoila Shutter  Procedure(s) Performed: XI ROBOTIC ASSISTED TOTAL HYSTERECTOMY WITH BILATERAL SALPINGO-OOPHORECTOMY (Abdomen) PERINEORRHAPHY (Vagina ) TRANSVAGINAL TAPE (TVT) PROCEDURE (Vagina ) CYSTOSCOPY (Bladder) XI ROBOTIC ASSISTED LAPAROSCOPIC SACROCOLPOPEXY (Abdomen)     Patient location during evaluation: PACU Anesthesia Type: General Level of consciousness: awake and alert Pain management: pain level controlled Vital Signs Assessment: post-procedure vital signs reviewed and stable Respiratory status: spontaneous breathing, nonlabored ventilation, respiratory function stable and patient connected to nasal cannula oxygen Cardiovascular status: blood pressure returned to baseline and stable Postop Assessment: no apparent nausea or vomiting Anesthetic complications: no   No notable events documented.              Shelton Silvas

## 2022-08-28 ENCOUNTER — Encounter (HOSPITAL_BASED_OUTPATIENT_CLINIC_OR_DEPARTMENT_OTHER): Payer: Self-pay | Admitting: Obstetrics and Gynecology

## 2022-09-04 ENCOUNTER — Telehealth: Payer: Self-pay | Admitting: Obstetrics and Gynecology

## 2022-09-04 NOTE — Telephone Encounter (Addendum)
Has had some nausea this week. Has been able to eat and is keeping food down. She has had multiple bowel movements. She felt cold yesterday and took her temp and was 100.4. She took tylenol and temp decreased to normal. This morning her temp was 99 after not taking any medication overnight. Pain has been minimal. She feels that her incisions are healing well.   Will have her come to the office tomorrow for an exam. We discussed that if her temp increases again then will need to consider a CT of the abdomen.   Marguerita Beards, MD     ----- Message from [facility] Latisia P sent at 09/04/2022  1:39 PM EDT ----- Regarding: Low grade fever Patient called stating she had surgery on 8/13 has been having low grade fever for 3 or 4 days, that has been taking care of with Tylenol. Yesterday was a normal temperature reading. She wanted to know if this was normal?

## 2022-09-05 ENCOUNTER — Inpatient Hospital Stay (HOSPITAL_COMMUNITY)
Admission: EM | Admit: 2022-09-05 | Discharge: 2022-09-08 | DRG: 863 | Disposition: A | Discharge status: Home / Self Care | Payer: PPO | Attending: Obstetrics and Gynecology | Admitting: Obstetrics and Gynecology

## 2022-09-05 ENCOUNTER — Ambulatory Visit: Payer: PPO | Admitting: Obstetrics and Gynecology

## 2022-09-05 ENCOUNTER — Emergency Department (HOSPITAL_COMMUNITY): Payer: PPO

## 2022-09-05 ENCOUNTER — Encounter (HOSPITAL_COMMUNITY): Payer: Self-pay

## 2022-09-05 ENCOUNTER — Other Ambulatory Visit (HOSPITAL_COMMUNITY)
Admission: RE | Admit: 2022-09-05 | Discharge: 2022-09-05 | Disposition: A | Discharge status: Home / Self Care | Payer: PPO | Source: Other Acute Inpatient Hospital

## 2022-09-05 ENCOUNTER — Encounter: Payer: Self-pay | Admitting: Obstetrics and Gynecology

## 2022-09-05 ENCOUNTER — Other Ambulatory Visit: Payer: Self-pay

## 2022-09-05 VITALS — BP 122/72 | HR 109 | Temp 99.8°F

## 2022-09-05 DIAGNOSIS — R109 Unspecified abdominal pain: Secondary | ICD-10-CM | POA: Diagnosis not present

## 2022-09-05 DIAGNOSIS — K651 Peritoneal abscess: Secondary | ICD-10-CM | POA: Diagnosis present

## 2022-09-05 DIAGNOSIS — K5750 Diverticulosis of both small and large intestine without perforation or abscess without bleeding: Secondary | ICD-10-CM | POA: Diagnosis not present

## 2022-09-05 DIAGNOSIS — T8142XA Infection following a procedure, deep incisional surgical site, initial encounter: Secondary | ICD-10-CM | POA: Diagnosis not present

## 2022-09-05 DIAGNOSIS — Z881 Allergy status to other antibiotic agents status: Secondary | ICD-10-CM

## 2022-09-05 DIAGNOSIS — R509 Fever, unspecified: Secondary | ICD-10-CM | POA: Diagnosis not present

## 2022-09-05 DIAGNOSIS — T8143XA Infection following a procedure, organ and space surgical site, initial encounter: Secondary | ICD-10-CM | POA: Diagnosis not present

## 2022-09-05 DIAGNOSIS — N939 Abnormal uterine and vaginal bleeding, unspecified: Secondary | ICD-10-CM | POA: Diagnosis not present

## 2022-09-05 DIAGNOSIS — G35 Multiple sclerosis: Secondary | ICD-10-CM | POA: Diagnosis present

## 2022-09-05 DIAGNOSIS — D649 Anemia, unspecified: Secondary | ICD-10-CM | POA: Diagnosis present

## 2022-09-05 DIAGNOSIS — B961 Klebsiella pneumoniae [K. pneumoniae] as the cause of diseases classified elsewhere: Secondary | ICD-10-CM | POA: Diagnosis present

## 2022-09-05 DIAGNOSIS — E876 Hypokalemia: Secondary | ICD-10-CM | POA: Diagnosis not present

## 2022-09-05 DIAGNOSIS — Z1152 Encounter for screening for COVID-19: Secondary | ICD-10-CM

## 2022-09-05 DIAGNOSIS — Z823 Family history of stroke: Secondary | ICD-10-CM

## 2022-09-05 DIAGNOSIS — N739 Female pelvic inflammatory disease, unspecified: Secondary | ICD-10-CM | POA: Diagnosis present

## 2022-09-05 DIAGNOSIS — Z880 Allergy status to penicillin: Secondary | ICD-10-CM

## 2022-09-05 DIAGNOSIS — R000 Tachycardia, unspecified: Secondary | ICD-10-CM | POA: Diagnosis not present

## 2022-09-05 DIAGNOSIS — E871 Hypo-osmolality and hyponatremia: Secondary | ICD-10-CM | POA: Diagnosis present

## 2022-09-05 DIAGNOSIS — G8114 Spastic hemiplegia affecting left nondominant side: Secondary | ICD-10-CM | POA: Diagnosis present

## 2022-09-05 DIAGNOSIS — Z8249 Family history of ischemic heart disease and other diseases of the circulatory system: Secondary | ICD-10-CM

## 2022-09-05 DIAGNOSIS — Z9889 Other specified postprocedural states: Secondary | ICD-10-CM

## 2022-09-05 DIAGNOSIS — Z90710 Acquired absence of both cervix and uterus: Secondary | ICD-10-CM

## 2022-09-05 LAB — CBC WITH DIFFERENTIAL/PLATELET
Abs Immature Granulocytes: 0.05 10*3/uL (ref 0.00–0.07)
Basophils Absolute: 0 10*3/uL (ref 0.0–0.1)
Basophils Relative: 0 %
Eosinophils Absolute: 0 10*3/uL (ref 0.0–0.5)
Eosinophils Relative: 0 %
HCT: 31.8 % — ABNORMAL LOW (ref 36.0–46.0)
Hemoglobin: 10.9 g/dL — ABNORMAL LOW (ref 12.0–15.0)
Immature Granulocytes: 0 %
Lymphocytes Relative: 2 %
Lymphs Abs: 0.3 10*3/uL — ABNORMAL LOW (ref 0.7–4.0)
MCH: 31.9 pg (ref 26.0–34.0)
MCHC: 34.3 g/dL (ref 30.0–36.0)
MCV: 93 fL (ref 80.0–100.0)
Monocytes Absolute: 0.8 10*3/uL (ref 0.1–1.0)
Monocytes Relative: 5 %
Neutro Abs: 12.8 10*3/uL — ABNORMAL HIGH (ref 1.7–7.7)
Neutrophils Relative %: 93 %
Platelets: 295 10*3/uL (ref 150–400)
RBC: 3.42 MIL/uL — ABNORMAL LOW (ref 3.87–5.11)
RDW: 12.1 % (ref 11.5–15.5)
WBC: 13.9 10*3/uL — ABNORMAL HIGH (ref 4.0–10.5)
nRBC: 0 % (ref 0.0–0.2)

## 2022-09-05 LAB — RESP PANEL BY RT-PCR (RSV, FLU A&B, COVID)  RVPGX2
Influenza A by PCR: NEGATIVE
Influenza B by PCR: NEGATIVE
Resp Syncytial Virus by PCR: NEGATIVE
SARS Coronavirus 2 by RT PCR: NEGATIVE

## 2022-09-05 LAB — COMPREHENSIVE METABOLIC PANEL
ALT: 19 U/L (ref 0–44)
AST: 15 U/L (ref 15–41)
Albumin: 3.6 g/dL (ref 3.5–5.0)
Alkaline Phosphatase: 67 U/L (ref 38–126)
Anion gap: 7 (ref 5–15)
BUN: 14 mg/dL (ref 8–23)
CO2: 24 mmol/L (ref 22–32)
Calcium: 8.3 mg/dL — ABNORMAL LOW (ref 8.9–10.3)
Chloride: 99 mmol/L (ref 98–111)
Creatinine, Ser: 0.71 mg/dL (ref 0.44–1.00)
GFR, Estimated: >60 mL/min (ref >60)
Glucose, Bld: 117 mg/dL — ABNORMAL HIGH (ref 70–99)
Potassium: 3.4 mmol/L — ABNORMAL LOW (ref 3.5–5.1)
Sodium: 130 mmol/L — ABNORMAL LOW (ref 135–145)
Total Bilirubin: 0.6 mg/dL (ref 0.3–1.2)
Total Protein: 6.8 g/dL (ref 6.5–8.1)

## 2022-09-05 LAB — PROTIME-INR
INR: 1.1 (ref 0.8–1.2)
Prothrombin Time: 14.3 seconds (ref 11.4–15.2)

## 2022-09-05 LAB — URINALYSIS, W/ REFLEX TO CULTURE (INFECTION SUSPECTED)
Bacteria, UA: NONE SEEN
Bilirubin Urine: NEGATIVE
Glucose, UA: NEGATIVE mg/dL
Ketones, ur: 20 mg/dL — AB
Nitrite: NEGATIVE
Protein, ur: NEGATIVE mg/dL
Specific Gravity, Urine: 1.015 (ref 1.005–1.030)
pH: 6 (ref 5.0–8.0)

## 2022-09-05 LAB — APTT: aPTT: 25 seconds (ref 24–36)

## 2022-09-05 LAB — I-STAT CG4 LACTIC ACID, ED: Lactic Acid, Venous: 0.6 mmol/L (ref 0.5–1.9)

## 2022-09-05 MED ORDER — SODIUM CHLORIDE 0.9 % IV SOLN
2.0000 g | Freq: Once | INTRAVENOUS | Status: AC
  Administered 2022-09-05: 2 g via INTRAVENOUS
  Filled 2022-09-05: qty 20

## 2022-09-05 MED ORDER — IBUPROFEN 800 MG PO TABS
800.0000 mg | ORAL_TABLET | Freq: Once | ORAL | Status: AC
  Administered 2022-09-05: 800 mg via ORAL
  Filled 2022-09-05: qty 1

## 2022-09-05 MED ORDER — IBUPROFEN 400 MG PO TABS: 600.0000 mg | ORAL_TABLET | Freq: Four times a day (QID) | ORAL | Status: DC | PRN

## 2022-09-05 MED ORDER — METRONIDAZOLE 500 MG/100ML IV SOLN
500.0000 mg | Freq: Once | INTRAVENOUS | Status: AC
  Administered 2022-09-05: 500 mg via INTRAVENOUS
  Filled 2022-09-05: qty 100

## 2022-09-05 MED ORDER — LACTATED RINGERS IV SOLN: INTRAVENOUS | Status: AC

## 2022-09-05 MED ORDER — IOHEXOL 300 MG/ML  SOLN
100.0000 mL | Freq: Once | INTRAMUSCULAR | Status: AC | PRN
  Administered 2022-09-05: 100 mL via INTRAVENOUS

## 2022-09-05 MED ORDER — LACTATED RINGERS IV BOLUS (SEPSIS)
1000.0000 mL | Freq: Once | INTRAVENOUS | Status: AC
  Administered 2022-09-05: 1000 mL via INTRAVENOUS

## 2022-09-05 NOTE — ED Provider Notes (Signed)
[facility] AT [facility] Provider Note   CSN: [id number] Arrival date & time: 09/05/22  1449     History  Chief Complaint  Patient presents with   Post-op Problem    Heather [name] is a 67 y.o. female.  67 year old female with prior medical history as detailed below presents for evaluation.  Patient sent in by GYN for evaluation.  She is status post robotic assisted total laparoscopic hysterectomy, bilateral salpingectomy, sacrocolpopexy (vertessa Lite Y), midurethral sling (Advantage Fit), cystoscopy, perineorrhaphy on 08/26/22   Pt has been having low grade fevers at home- 100.4 and 100.7 then down to 99. She reports her pain has been minimal. Tolerating regular diet but has some nausea. Having regular bowel movements and passing gas. Denies vaginal bleeding or discharge.   Patient seen by Dr. Florian Buff earlier today.  Dr. Hyacinth Meeker on-call for GYN  The history is provided by the patient and medical records.       Home Medications Prior to Admission medications   Medication Sig Start Date End Date Taking? Authorizing Provider  ascorbic acid (VITAMIN C) 500 MG tablet Take 1,000 mg by mouth daily.    [provider]  baclofen (LIORESAL) 10 MG tablet Take 0.5 tablets (5 mg total) by mouth 2 (two) times daily. 07/29/22   Glean Salvo, NP  Cholecalciferol (VITAMIN D) 50 MCG (2000 UT) tablet Take 2,000 Units by mouth daily.    [provider]  ELDERBERRY PO Take by mouth as needed. gummies    [provider]  estradiol (ESTRACE) 0.1 MG/GM vaginal cream Place 0.5 g vaginally 2 (two) times a week. Place 0.5g nightly for two weeks then twice a week after 08/11/22   Selmer Dominion, NP  Ibuprofen (ADVIL PO) Take by mouth as needed.     [provider]  ibuprofen (ADVIL) 600 MG tablet Take 1 tablet (600 mg total) by mouth every 6 (six) hours as needed. 08/11/22   Selmer Dominion, NP  Multiple Vitamin (MULTIVITAMIN) capsule  Take 1 capsule by mouth daily.    [provider]  ocrelizumab (OCREVUS) 300 MG/10ML injection Inject 600 mg into the vein. 300mg IV on day 1 &15. 600mg  IV q 6 months thereafter. [facility].    [provider]  polyethylene glycol powder (GLYCOLAX/MIRALAX) 17 GM/SCOOP powder Take 17 g by mouth daily. Drink 17g (1 scoop) dissolved in water per day. 08/11/22   Selmer Dominion, NP      Allergies    Erythromycin and Penicillins    Review of Systems   Review of Systems  All other systems reviewed and are negative.   Physical Exam Updated Vital Signs BP 124/73 (BP Location: Left Arm)   Pulse (!) 114   Temp (!) 100.7 F (38.2 C) (Oral)   Resp 16   Ht 5\' 3"  (1.6 m)   Wt 62.6 kg   SpO2 98%   BMI 24.45 kg/m  Physical Exam Vitals and nursing note reviewed.  Constitutional:      General: She is not in acute distress.    Appearance: Normal appearance. She is well-developed.  HENT:     Head: Normocephalic and atraumatic.  Eyes:     Conjunctiva/sclera: Conjunctivae normal.     Pupils: Pupils are equal, round, and reactive to light.  Cardiovascular:     Rate and Rhythm: Normal rate and regular rhythm.     Heart sounds: Normal heart sounds.  Pulmonary:     Effort: Pulmonary effort  is normal. No respiratory distress.     Breath sounds: Normal breath sounds.  Abdominal:     General: There is no distension.     Palpations: Abdomen is soft.     Tenderness: There is no abdominal tenderness.  Musculoskeletal:        General: No deformity. Normal range of motion.     Cervical back: Normal range of motion and neck supple.  Skin:    General: Skin is warm and dry.  Neurological:     General: No focal deficit present.     Mental Status: She is alert and oriented to person, place, and time.     ED Results / Procedures / Treatments   Labs (all labs ordered are listed, but only abnormal results are displayed) Labs Reviewed  COMPREHENSIVE METABOLIC PANEL - Abnormal;  Notable for the following components:      Result Value   Sodium 130 (*)    Potassium 3.4 (*)    Glucose, Bld 117 (*)    Calcium 8.3 (*)    All other components within normal limits  CBC WITH DIFFERENTIAL/PLATELET - Abnormal; Notable for the following components:   WBC 13.9 (*)    RBC 3.42 (*)    Hemoglobin 10.9 (*)    HCT 31.8 (*)    Neutro Abs 12.8 (*)    Lymphs Abs 0.3 (*)    All other components within normal limits  RESP PANEL BY RT-PCR (RSV, FLU A&B, COVID)  RVPGX2  BODY FLUID CULTURE W GRAM STAIN  CULTURE, BLOOD (ROUTINE X 2)  CULTURE, BLOOD (ROUTINE X 2)  PROTIME-INR  APTT  URINALYSIS, W/ REFLEX TO CULTURE (INFECTION SUSPECTED)  I-STAT CG4 LACTIC ACID, ED  I-STAT CG4 LACTIC ACID, ED    EKG None  Radiology DG Chest 2 View  Result Date: 09/05/2022 CLINICAL DATA:  Fever. EXAM: CHEST - 2 VIEW COMPARISON:  None Available. FINDINGS: The heart size and mediastinal contours are within normal limits. Both lungs are clear. Moderate compression deformity of upper thoracic vertebral body is noted most consistent with old fracture. IMPRESSION: No active cardiopulmonary disease. Electronically Signed   By: Lupita Raider M.D.   On: 09/05/2022 16:09    Procedures Procedures    Medications Ordered in ED Medications  lactated ringers infusion (has no administration in time range)  lactated ringers bolus 1,000 mL (has no administration in time range)  ibuprofen (ADVIL) tablet 800 mg (800 mg Oral Given 09/05/22 1505)    ED Course/ Medical Decision Making/ A&P                                 Medical Decision Making Amount and/or Complexity of Data Reviewed Labs: ordered. Radiology: ordered.  Risk Prescription drug management. Decision regarding hospitalization.    Medical Screen Complete  This patient presented to the ED with complaint of fever.  This complaint involves an extensive number of treatment options. The initial differential diagnosis includes, but is  not limited to, abscess, metabolic abnormality, etc.  This presentation is: Acute, Chronic, Self-Limited, Previously Undiagnosed, Uncertain Prognosis, Complicated, Systemic Symptoms, and Threat to Life/Bodily Function  Patient is presenting postoperatively for evaluation of fevers.  CT imaging suggest early abscess.  Additionally patient with hyponatremia and elevated white count.  Dr. Hyacinth Meeker with GYN is aware of case.  She is evaluating the patient here in the ED.  Patient to be admitted.  Of note, at time  of admission patient is comfortable.  Her hemodynamics are without significant abnormality.  No evidence of overt overwhelming sepsis noted.    Consultations Obtained:  I consulted Dr. Hyacinth Meeker with GYN,  and discussed lab and imaging findings as well as pertinent plan of care.    Problem List / ED Course:  Intra-abdominal abscess, elevated white count, hyponatremia   Reevaluation:  After the interventions noted above, I reevaluated the patient and found that they have: improved    Disposition:  After consideration of the diagnostic results and the patients response to treatment, I feel that the patent would benefit from admission.          Final Clinical Impression(s) / ED Diagnoses Final diagnoses:  Fever, unspecified fever cause    Rx / DC Orders ED Discharge Orders     None         Wynetta Fines, MD 09/05/22 2047

## 2022-09-05 NOTE — ED Notes (Signed)
PCP wants CT abd/pelvis for post-op hysterectomy/prolapse repair. Pt immunocompromised

## 2022-09-05 NOTE — ED Triage Notes (Signed)
Hysterectomy 8/13. Incisions are closed and healing appropriately.  Fever, fatigue, headache and nausea started Wednesday. Pt states relief with ibuprofen at home. Surgeon wanted pt to be evaluated.

## 2022-09-05 NOTE — Progress Notes (Signed)
[facility]  Date of Visit: 09/05/2022  History of Present Illness: Ms. Hudman is a 67 y.o. female scheduled today for a post-operative visit.   Surgery: s/p Robotic assisted total laparoscopic hysterectomy, bilateral salpingectomy, sacrocolpopexy (vertessa Lite Y), midurethral sling (Advantage Fit), cystoscopy, perineorrhaphy on 08/26/22  Pt has been having low grade fevers at home- 100.4 and 100.7 then down to 99. She reports her pain has been minimal. Tolerating regular diet but has some nausea. Having regular bowel movements and passing gas. Denies vaginal bleeding or discharge.   Pt has MS and is on Ocrevus- last infusion was March 2024.   Medications: She has a current medication list which includes the following prescription(s): ascorbic acid, baclofen, vitamin d, elderberry, estradiol, ibuprofen, ibuprofen, multivitamin, ocrevus, and polyethylene glycol powder.   Allergies: Patient is allergic to erythromycin and penicillins.   Physical Exam: BP 122/72   Pulse (!) 109   Temp 99.8 F (37.7 C) (Oral)   Abdomen: soft, non-tender, without masses or organomegaly Laparoscopic Incisions: healing well, covered with skin glue, some bruising surrounding but no induration present.  Pelvic Examination: Vagina: cuff with white area on left corner- unable to express any discharge but culture obtained, may be cauterized tissue healing. Otherwise cuff appears normal and intact. No pain on bimanual.    ---------------------------------------------------------  Assessment and Plan:  1. Post-operative state     - Given that she has low grade fevers and recently post op, will have her go to ER at [facility] to get a CT scan of abdomen and pelvis.  - vaginal culture swab sent - Dr Hyacinth Meeker is the on call physician and has been informed.   All questions answered.   Marguerita Beards, MD

## 2022-09-05 NOTE — H&P (Signed)
Heather [name] is an 67 y.o. female G2P2 WF who is 10 days post op from [facility], bilateral salpingectomy, sacrocolpopexy (vertessa Lite Y), midurethral sling (Advantage Fit), cystoscopy, perineorrhaphy by Dr. Lanetta Inch who presented to clinic today with several complaints including low grade fevers at home, up to 100.7, nausea, some mild headache, and just over-all not feeling great.  Has had minimal vaginal drainage.  Denies vaginal bleeding.  She is passing gas and having bowel movements.    Exam was significant for small area on the left edge of the cuff that could be a small area of infection but it was probed and there was no drainage.  Due to her symptoms, pt was sent to the ER for CT scan and additional evaluation.  She reports no new symptoms than those reported above.  Feels she has been healing well, just hasn't felt great.  She does have a hx of MS and is on Ocrevus with last infusion 03/2022.  She is supposed to have another infusion in September but this has been delayed until October given recent surgery.  Neurologist is Dr. Marjory Lies at [facility].  In ER, lactic acid is 0.6, WBC ct is 13.9 with left shift, potassium of 3.4.  CT shows early abscess in the pelvic of 4.8cm.  there does appear to be possible fistula on CT but pt is not reporting any drainage.  There is some scattered small amt of fluid as well.    Past Medical History:  Diagnosis Date   Ambulates with cane    Family history of adverse reaction to anesthesia    mother has N & V   Headache    from tmj joint   Left spastic hemiparesis (HCC) 11/01/2018   diagnosed w/ MS in 2020, left leg weak from MS   Mitral valve prolapse    no problems   Multiple sclerosis (HCC) 12/07/2018   Follows w/ [facility], Dr. Joycelyn Schmid. treated w/ Ocrevus infusions every 6 months.   Prolapse of female pelvic organs    Stage IV. Follows w/ Dr. Florian Buff. Surgery scheduled for 08/26/22.   Wears partial  dentures    1 tooth    Past Surgical History:  Procedure Laterality Date   BLADDER SUSPENSION N/A 08/26/2022   Procedure: TRANSVAGINAL TAPE (TVT) PROCEDURE;  Surgeon: Marguerita Beards, MD;  Location: [facility];  Service: Gynecology;  Laterality: N/A;   CHOLECYSTECTOMY  2013   laparascopic   COLONOSCOPY  2019   1 polyp   CYSTOSCOPY N/A 08/26/2022   Procedure: CYSTOSCOPY;  Surgeon: Marguerita Beards, MD;  Location: [facility];  Service: Gynecology;  Laterality: N/A;   FINGER SURGERY  2017   right hand   RECTOCELE REPAIR N/A 08/26/2022   Procedure: PERINEORRHAPHY;  Surgeon: Marguerita Beards, MD;  Location: [facility];  Service: Gynecology;  Laterality: N/A;   ROBOTIC ASSISTED LAPAROSCOPIC SACROCOLPOPEXY  08/26/2022   Procedure: XI ROBOTIC ASSISTED LAPAROSCOPIC SACROCOLPOPEXY;  Surgeon: Marguerita Beards, MD;  Location: [facility];  Service: Gynecology;;   TUBAL LIGATION  1997   XI ROBOTIC ASSISTED TOTAL HYSTERECTOMY WITH SACROCOLPOPEXY N/A 08/26/2022   Procedure: XI ROBOTIC ASSISTED TOTAL HYSTERECTOMY WITH BILATERAL SALPINGO-OOPHORECTOMY;  Surgeon: Marguerita Beards, MD;  Location: [facility];  Service: Gynecology;  Laterality: N/A;    Family History  Problem Relation Age of Onset   Peripheral Artery Disease Mother    Heart disease Father    Stroke  Father     Social History:  reports that she has never smoked. She has never used smokeless tobacco. She reports that she does not drink alcohol and does not use drugs.  Allergies:  Allergies  Allergen Reactions   Erythromycin Rash   Penicillins Rash    (Not in a hospital admission)   Review of Systems  Constitutional:  Positive for appetite change and fever (low grade).  Respiratory: Negative.    Cardiovascular: Negative.   Gastrointestinal:  Positive for abdominal distention, constipation and nausea.  Genitourinary: Negative.    Musculoskeletal: Negative.   Hematological:  Bruises/bleeds easily.  Psychiatric/Behavioral: Negative.      Blood pressure 121/73, pulse 99, temperature 98.2 F (36.8 C), resp. rate 16, height 5\' 3"  (1.6 m), weight 62.6 kg, SpO2 99%. Physical Exam Constitutional:      Appearance: Normal appearance. She is normal weight.  HENT:     Head: Atraumatic.  Eyes:     Pupils: Pupils are equal, round, and reactive to light.  Cardiovascular:     Rate and Rhythm: Regular rhythm. Tachycardia present.     Pulses: Normal pulses.     Heart sounds: Normal heart sounds.  Pulmonary:     Effort: Pulmonary effort is normal.     Breath sounds: Normal breath sounds.  Abdominal:     General: Bowel sounds are normal. There is distension.     Palpations: Abdomen is soft. There is no mass.     Tenderness: There is no abdominal tenderness. There is no guarding.  Musculoskeletal:        General: Normal range of motion.     Cervical back: Normal range of motion.  Skin:    General: Skin is warm and dry.  Neurological:     Mental Status: She is alert and oriented to person, place, and time.     Gait: Gait abnormal (uses cane).  Psychiatric:        Mood and Affect: Mood normal.        Behavior: Behavior normal.        Thought Content: Thought content normal.        Judgment: Judgment normal.     Results for orders placed or performed during the hospital encounter of 09/05/22 (from the past 24 hour(s))  Comprehensive metabolic panel     Status: Abnormal   Collection Time: 09/05/22  3:25 PM  Result Value Ref Range   Sodium 130 (L) 135 - 145 mmol/L   Potassium 3.4 (L) 3.5 - 5.1 mmol/L   Chloride 99 98 - 111 mmol/L   CO2 24 22 - 32 mmol/L   Glucose, Bld 117 (H) 70 - 99 mg/dL   BUN 14 8 - 23 mg/dL   Creatinine, Ser 6.57 0.44 - 1.00 mg/dL   Calcium 8.3 (L) 8.9 - 10.3 mg/dL   Total Protein 6.8 6.5 - 8.1 g/dL   Albumin 3.6 3.5 - 5.0 g/dL   AST 15 15 - 41 U/L   ALT 19 0 - 44 U/L   Alkaline  Phosphatase 67 38 - 126 U/L   Total Bilirubin 0.6 0.3 - 1.2 mg/dL   GFR, Estimated >84 >69 mL/min   Anion gap 7 5 - 15  CBC with Differential     Status: Abnormal   Collection Time: 09/05/22  3:25 PM  Result Value Ref Range   WBC 13.9 (H) 4.0 - 10.5 K/uL   RBC 3.42 (L) 3.87 - 5.11 MIL/uL   Hemoglobin 10.9 (L) 12.0 -  15.0 g/dL   HCT 16.1 (L) 09.6 - 04.5 %   MCV 93.0 80.0 - 100.0 fL   MCH 31.9 26.0 - 34.0 pg   MCHC 34.3 30.0 - 36.0 g/dL   RDW 40.9 81.1 - 91.4 %   Platelets 295 150 - 400 K/uL   nRBC 0.0 0.0 - 0.2 %   Neutrophils Relative % 93 %   Neutro Abs 12.8 (H) 1.7 - 7.7 K/uL   Lymphocytes Relative 2 %   Lymphs Abs 0.3 (L) 0.7 - 4.0 K/uL   Monocytes Relative 5 %   Monocytes Absolute 0.8 0.1 - 1.0 K/uL   Eosinophils Relative 0 %   Eosinophils Absolute 0.0 0.0 - 0.5 K/uL   Basophils Relative 0 %   Basophils Absolute 0.0 0.0 - 0.1 K/uL   Immature Granulocytes 0 %   Abs Immature Granulocytes 0.05 0.00 - 0.07 K/uL  Protime-INR     Status: None   Collection Time: 09/05/22  5:14 PM  Result Value Ref Range   Prothrombin Time 14.3 11.4 - 15.2 seconds   INR 1.1 0.8 - 1.2  APTT     Status: None   Collection Time: 09/05/22  5:14 PM  Result Value Ref Range   aPTT 25 24 - 36 seconds  I-Stat Lactic Acid, ED     Status: None   Collection Time: 09/05/22  5:24 PM  Result Value Ref Range   Lactic Acid, Venous 0.6 0.5 - 1.9 mmol/L  Resp panel by RT-PCR (RSV, Flu A&B, Covid) Anterior Nasal Swab     Status: None   Collection Time: 09/05/22  5:29 PM   Specimen: Anterior Nasal Swab  Result Value Ref Range   SARS Coronavirus 2 by RT PCR NEGATIVE NEGATIVE   Influenza A by PCR NEGATIVE NEGATIVE   Influenza B by PCR NEGATIVE NEGATIVE   Resp Syncytial Virus by PCR NEGATIVE NEGATIVE  Body fluid culture w Gram Stain     Status: None (Preliminary result)   Collection Time: 09/05/22  5:47 PM   Specimen: Body Fluid  Result Value Ref Range   Specimen Description      FLUID Performed at [facility], [address], [place], [place] [address]    Special Requests      NONE Performed at [facility], [address], [place], [place] [address]    Gram Stain      RARE WBC PRESENT,BOTH PMN AND MONONUCLEAR FEW GRAM POSITIVE COCCI FEW GRAM NEGATIVE RODS RARE GRAM POSITIVE RODS Performed at [facility], [address], [place], [place] [address]    Culture PENDING    Report Status PENDING     CT ABDOMEN PELVIS W CONTRAST  Addendum Date: 09/05/2022   ADDENDUM REPORT: 09/05/2022 20:50 ADDENDUM: Not mentioned above, there is a small volume of scattered nonlocalizing fluid in the pelvis. There is no free hemorrhage. No free air is seen. Electronically Signed   By: Almira Bar M.D.   On: 09/05/2022 20:50   Result Date: 09/05/2022 CLINICAL DATA:  The patient underwent robotic assisted total hysterectomy with sacrocolpopexy, rectocele repair, and bladder suspension surgery 10 days ago, presenting today with abdominal pain postoperatively, fever, fatigue and nausea. The patient indicates ibuprofen somewhat relieves symptoms. EXAM: CT ABDOMEN AND PELVIS WITH CONTRAST TECHNIQUE: Multidetector CT imaging of the abdomen and pelvis was performed using the standard protocol following bolus administration of intravenous contrast. RADIATION DOSE REDUCTION: This exam was performed according to the departmental dose-optimization  program which includes automated exposure control, adjustment of the mA and/or kV according to patient size and/or use of iterative reconstruction technique. CONTRAST:  OMNIPAQUE IOHEXOL 300 MG/ML  SOLN COMPARISON:  None. FINDINGS: Lower chest: There is left lower lobe lateral basal linear scarring or atelectasis. Additional linear scarring in the right middle lobe base. Lung bases are clear of infiltrates. There are 2 right lower lobe pleural-based nodules each measuring 6 mm adjacent the posterior crest of the right hemidiaphragm  on 5:28 and 29. Lung bases are otherwise clear.  The cardiac size is normal. Hepatobiliary: No focal liver abnormality is seen. Status post cholecystectomy. No biliary dilatation. Pancreas: No focal abnormality. Spleen: No abnormality.  No splenomegaly. Adrenals/Urinary Tract: There is no adrenal mass. Both kidneys enhance homogeneously without mass. 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 Bilateral extrarenal pelves. There is no urinary stone or obstruction. Bladder appears normal for the degree of distention. Stomach/Bowel: Unremarkable contracted stomach. 2.4 cm diverticulum of the descending duodenum. The unopacified small bowel is otherwise unremarkable. The retrocecal appendix is normal and well visible. Scattered colonic diverticula noted without diverticulitis. Vascular/Lymphatic: Aortic atherosclerosis. No enlarged abdominal or pelvic lymph nodes. Reproductive: Surgically absent uterus.  No adnexal masses seen. Other: In the right anterior pelvic floor, there is complex heterogeneous mixed density collection in between the dorsal aspect of the right pubic bone and the right anterolateral bladder wall, deforming the adjacent bladder, Hounsfield density is 40 consistent with viscous complex fluid or phlegmon. The collection measures 4.8 cm AP and 2.3 cm transverse on 2:74, and 4.1 cm craniocaudal on 7:68. There are some stranding changes around this. There is a rim enhancing linear fistulous tract extending from the collection inferiorly into the lower vaginal canal, best seen on series 2 axial 79-82 and coronal reconstruction series 7 images 70-80. 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 There is no visible fistula into the bladder, and no air in the bladder. Musculoskeletal: There is a chronic appearing mild inferior endplate compression fracture of the L3 vertebral body, with 20-25% vertebral height loss. There is advanced facet hypertrophy at L4-5 with grade 1  L4-5 spondylolisthesis. Osteopenia and mild degenerative changes noted elsewhere with no acute or other significant osseous findings. There is no evidence of right pubic bone osteomyelitis along side the collection. IMPRESSION: 1. 4.8 x 2.3 x 4.1 cm complex collection in the right anterior pelvic floor in between the dorsal aspect of the right pubic bone and the right anterolateral bladder wall, with a rim enhancing linear fistulous tract extending from the collection inferiorly into the lower vaginal canal. 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 2. There is no visible fistula into the bladder, and no air in the bladder. The bladder wall is deformed by the collection. 3. Two 6 mm pleural-based nodules in the right lower lobe. Per Fleischner Society Guidelines, recommend a non-contrast Chest CT at 3-6 months, then consider another non-contrast Chest CT at 18-24 months. If patient is low risk for malignancy, non-contrast Chest CT at 18-24 months is optional. These guidelines do not apply to immunocompromised patients and patients with cancer. Follow up in patients with significant comorbidities as clinically warranted. For lung cancer screening, adhere to Lung-RADS guidelines. Reference: Radiology. 2017; 284(1):228-43. 4. Aortic atherosclerosis. 5. Diverticulosis without evidence of diverticulitis. 6. Chronic appearing mild inferior endplate compression fracture of the L3 vertebral body. 7. Osteopenia and degenerative change. Aortic Atherosclerosis (ICD10-I70.0). Electronically Signed: By: Mellody Dance  Chesser M.D. On: 09/05/2022 20:30   DG Chest 2 View  Result Date: 09/05/2022 CLINICAL DATA:  Fever. EXAM: CHEST - 2 VIEW COMPARISON:  None Available. FINDINGS: The heart size and mediastinal contours are within normal limits. Both lungs are clear. Moderate compression deformity of upper thoracic vertebral body is noted most consistent with old fracture. IMPRESSION: No active cardiopulmonary  disease. Electronically Signed   By: Lupita Raider M.D.   On: 09/05/2022 16:09    Assessment/Plan: 67 yo G2P2 WF 10 days post op from [facility], bilateral salpingectomy, sacrocolpopexy (vertessa Lite Y), midurethral sling (Advantage Fit), cystoscopy, perineorrhaphy by Dr. Lanetta Inch, now with early 4.8cm abscess and findings on CT concerning for fistula.  She is not having any drainage at this time.    Will need to admit for IV antibiotics, monitoring WBC ct, probable repeat CT, fluid and electrolyte replacement.  Have discussed with pt and family and all questions answered.    Jerene Bears 09/05/2022, 9:31 PM

## 2022-09-05 NOTE — Sepsis Progress Note (Signed)
Sent secure chat to bedside RN and MD, pt still in the lobby and has not been moved to a room.

## 2022-09-05 NOTE — Sepsis Progress Note (Signed)
Sepsis protocol monitored by eLink ?

## 2022-09-06 DIAGNOSIS — N739 Female pelvic inflammatory disease, unspecified: Secondary | ICD-10-CM | POA: Diagnosis not present

## 2022-09-06 DIAGNOSIS — R509 Fever, unspecified: Secondary | ICD-10-CM | POA: Diagnosis not present

## 2022-09-06 DIAGNOSIS — Z880 Allergy status to penicillin: Secondary | ICD-10-CM | POA: Diagnosis not present

## 2022-09-06 DIAGNOSIS — Z1152 Encounter for screening for COVID-19: Secondary | ICD-10-CM | POA: Diagnosis not present

## 2022-09-06 DIAGNOSIS — G8114 Spastic hemiplegia affecting left nondominant side: Secondary | ICD-10-CM | POA: Diagnosis not present

## 2022-09-06 DIAGNOSIS — E871 Hypo-osmolality and hyponatremia: Secondary | ICD-10-CM | POA: Diagnosis not present

## 2022-09-06 DIAGNOSIS — T8143XA Infection following a procedure, organ and space surgical site, initial encounter: Secondary | ICD-10-CM | POA: Diagnosis not present

## 2022-09-06 DIAGNOSIS — Z8249 Family history of ischemic heart disease and other diseases of the circulatory system: Secondary | ICD-10-CM | POA: Diagnosis not present

## 2022-09-06 DIAGNOSIS — K651 Peritoneal abscess: Secondary | ICD-10-CM | POA: Diagnosis present

## 2022-09-06 DIAGNOSIS — Z881 Allergy status to other antibiotic agents status: Secondary | ICD-10-CM | POA: Diagnosis not present

## 2022-09-06 DIAGNOSIS — T8142XA Infection following a procedure, deep incisional surgical site, initial encounter: Secondary | ICD-10-CM | POA: Diagnosis not present

## 2022-09-06 DIAGNOSIS — D649 Anemia, unspecified: Secondary | ICD-10-CM | POA: Diagnosis not present

## 2022-09-06 DIAGNOSIS — N939 Abnormal uterine and vaginal bleeding, unspecified: Secondary | ICD-10-CM | POA: Diagnosis not present

## 2022-09-06 DIAGNOSIS — E876 Hypokalemia: Secondary | ICD-10-CM

## 2022-09-06 DIAGNOSIS — Z90710 Acquired absence of both cervix and uterus: Secondary | ICD-10-CM | POA: Diagnosis not present

## 2022-09-06 DIAGNOSIS — Z823 Family history of stroke: Secondary | ICD-10-CM | POA: Diagnosis not present

## 2022-09-06 DIAGNOSIS — B961 Klebsiella pneumoniae [K. pneumoniae] as the cause of diseases classified elsewhere: Secondary | ICD-10-CM | POA: Diagnosis not present

## 2022-09-06 DIAGNOSIS — G35 Multiple sclerosis: Secondary | ICD-10-CM | POA: Diagnosis not present

## 2022-09-06 DIAGNOSIS — Z9049 Acquired absence of other specified parts of digestive tract: Secondary | ICD-10-CM | POA: Diagnosis not present

## 2022-09-06 LAB — COMPREHENSIVE METABOLIC PANEL
ALT: 18 U/L (ref 0–44)
AST: 16 U/L (ref 15–41)
Albumin: 2.8 g/dL — ABNORMAL LOW (ref 3.5–5.0)
Alkaline Phosphatase: 61 U/L (ref 38–126)
Anion gap: 8 (ref 5–15)
BUN: 13 mg/dL (ref 8–23)
CO2: 23 mmol/L (ref 22–32)
Calcium: 7.8 mg/dL — ABNORMAL LOW (ref 8.9–10.3)
Chloride: 103 mmol/L (ref 98–111)
Creatinine, Ser: 0.65 mg/dL (ref 0.44–1.00)
GFR, Estimated: >60 mL/min (ref >60)
Glucose, Bld: 120 mg/dL — ABNORMAL HIGH (ref 70–99)
Potassium: 2.9 mmol/L — ABNORMAL LOW (ref 3.5–5.1)
Sodium: 134 mmol/L — ABNORMAL LOW (ref 135–145)
Total Bilirubin: 0.5 mg/dL (ref 0.3–1.2)
Total Protein: 5.5 g/dL — ABNORMAL LOW (ref 6.5–8.1)

## 2022-09-06 LAB — CBC WITH DIFFERENTIAL/PLATELET
Abs Immature Granulocytes: 0.03 10*3/uL (ref 0.00–0.07)
Basophils Absolute: 0 10*3/uL (ref 0.0–0.1)
Basophils Relative: 0 %
Eosinophils Absolute: 0 10*3/uL (ref 0.0–0.5)
Eosinophils Relative: 0 %
HCT: 27.9 % — ABNORMAL LOW (ref 36.0–46.0)
Hemoglobin: 9.3 g/dL — ABNORMAL LOW (ref 12.0–15.0)
Immature Granulocytes: 0 %
Lymphocytes Relative: 6 %
Lymphs Abs: 0.5 10*3/uL — ABNORMAL LOW (ref 0.7–4.0)
MCH: 31.5 pg (ref 26.0–34.0)
MCHC: 33.3 g/dL (ref 30.0–36.0)
MCV: 94.6 fL (ref 80.0–100.0)
Monocytes Absolute: 0.7 10*3/uL (ref 0.1–1.0)
Monocytes Relative: 9 %
Neutro Abs: 7.4 10*3/uL (ref 1.7–7.7)
Neutrophils Relative %: 85 %
Platelets: 239 10*3/uL (ref 150–400)
RBC: 2.95 MIL/uL — ABNORMAL LOW (ref 3.87–5.11)
RDW: 12.5 % (ref 11.5–15.5)
WBC: 8.6 10*3/uL (ref 4.0–10.5)
nRBC: 0 % (ref 0.0–0.2)

## 2022-09-06 MED ORDER — METRONIDAZOLE 500 MG/100ML IV SOLN
500.0000 mg | Freq: Two times a day (BID) | INTRAVENOUS | Status: DC
  Administered 2022-09-06 – 2022-09-08 (×5): 500 mg via INTRAVENOUS
  Filled 2022-09-06 (×5): qty 100

## 2022-09-06 MED ORDER — SIMETHICONE 80 MG PO CHEW: 80.0000 mg | CHEWABLE_TABLET | Freq: Four times a day (QID) | ORAL | Status: DC | PRN

## 2022-09-06 MED ORDER — KCL IN DEXTROSE-NACL 20-5-0.45 MEQ/L-%-% IV SOLN
INTRAVENOUS | Status: DC
  Filled 2022-09-06 (×2): qty 1000

## 2022-09-06 MED ORDER — IBUPROFEN 200 MG PO TABS
600.0000 mg | ORAL_TABLET | Freq: Four times a day (QID) | ORAL | Status: DC | PRN
  Administered 2022-09-06 – 2022-09-08 (×2): 600 mg via ORAL
  Filled 2022-09-06 (×2): qty 3

## 2022-09-06 MED ORDER — METRONIDAZOLE 500 MG PO TABS: 500.0000 mg | ORAL_TABLET | Freq: Three times a day (TID) | ORAL | Status: DC

## 2022-09-06 MED ORDER — ACETAMINOPHEN 325 MG PO TABS: 650.0000 mg | ORAL_TABLET | ORAL | Status: DC | PRN

## 2022-09-06 MED ORDER — ONDANSETRON HCL 4 MG PO TABS: 4.0000 mg | ORAL_TABLET | Freq: Four times a day (QID) | ORAL | Status: DC | PRN

## 2022-09-06 MED ORDER — MENTHOL 3 MG MT LOZG: 1.0000 | LOZENGE | OROMUCOSAL | Status: DC | PRN

## 2022-09-06 MED ORDER — BACLOFEN 10 MG PO TABS
5.0000 mg | ORAL_TABLET | Freq: Two times a day (BID) | ORAL | Status: DC
  Administered 2022-09-06 – 2022-09-08 (×4): 5 mg via ORAL
  Filled 2022-09-06 (×4): qty 1

## 2022-09-06 MED ORDER — MORPHINE SULFATE (PF) 2 MG/ML IV SOLN: 1.0000 mg | INTRAVENOUS | Status: DC | PRN

## 2022-09-06 MED ORDER — POTASSIUM CHLORIDE CRYS ER 20 MEQ PO TBCR
30.0000 meq | EXTENDED_RELEASE_TABLET | Freq: Two times a day (BID) | ORAL | Status: AC
  Administered 2022-09-06 (×2): 30 meq via ORAL
  Filled 2022-09-06 (×2): qty 1

## 2022-09-06 MED ORDER — HYDROCODONE-ACETAMINOPHEN 5-325 MG PO TABS: 1.0000 | ORAL_TABLET | Freq: Four times a day (QID) | ORAL | Status: DC | PRN

## 2022-09-06 MED ORDER — SODIUM CHLORIDE 0.9 % IV SOLN
2.0000 g | Freq: Every day | INTRAVENOUS | Status: DC
  Administered 2022-09-06 – 2022-09-07 (×2): 2 g via INTRAVENOUS
  Filled 2022-09-06 (×2): qty 20

## 2022-09-06 MED ORDER — ONDANSETRON HCL 4 MG/2ML IJ SOLN: 4.0000 mg | Freq: Four times a day (QID) | INTRAMUSCULAR | Status: DC | PRN

## 2022-09-06 MED ORDER — ACETAMINOPHEN 500 MG PO TABS
1000.0000 mg | ORAL_TABLET | Freq: Once | ORAL | Status: AC
  Administered 2022-09-06: 1000 mg via ORAL
  Filled 2022-09-06: qty 2

## 2022-09-06 MED ORDER — KETOROLAC TROMETHAMINE 30 MG/ML IJ SOLN: 30.0000 mg | Freq: Three times a day (TID) | INTRAMUSCULAR | Status: DC | PRN

## 2022-09-06 NOTE — Progress Notes (Signed)
Subjective: Patient reports no pain.  Nausea is better.  Feels pretty good.  Asks about possible discharge.  Reminded pt she will need at lest 48 hours of antibiotics and probably repeat imaging prior to discharge.  She did have some diarrhea last night but that was after the Rocephin.  No allergic reaction with this.    Objective: I have reviewed patient's vital signs, intake and output, medications, and labs. Vitals:   09/06/22 0607 09/06/22 0945  BP: (!) 106/59 101/60  Pulse: 96 (!) 102  Resp: 18 18  Temp: 99.9 F (37.7 C) 98.8 F (37.1 C)  SpO2: 97% 95%    General: alert, cooperative, and no distress Resp: clear to auscultation bilaterally Cardio: regular rate and rhythm, S1, S2 normal, no murmur, click, rub or gallop GI: normal findings: soft, non-tender and distension is still present, good BS noted and incision: clean, dry, and intact Extremities: extremities normal, atraumatic, no cyanosis or edema   Assessment/Plan:  Pelvic abscess  - continue IV rocephin and flagyl  - WBC ct improved today.  Continue to monitor daily   11 days post op from [facility], bilateral salpingectomy, sacrocolpopexy (vertessa Lite Y), midurethral sling (Advantage Fit), cystoscopy, perineorrhaphy by Dr. Lanetta Inch   - SCDs on  3.  Anemia  - will follow conservatively at this point.  Pt asymptomatic  4.  Hypokalemia  - replacing orally and in fluid  - recheck CMP  in AM   LOS: 0 days    Jerene Bears, MD 09/06/2022, 1:32 PM

## 2022-09-06 NOTE — Plan of Care (Signed)
  Problem: Education: Goal: Knowledge of the prescribed therapeutic regimen will improve Outcome: Progressing   Problem: Self-Concept: Goal: Communication of feelings regarding changes in body function or appearance will improve Outcome: Progressing   Problem: Clinical Measurements: Goal: Diagnostic test results will improve Outcome: Progressing   Problem: Activity: Goal: Risk for activity intolerance will decrease Outcome: Progressing   Problem: Skin Integrity: Goal: Demonstration of wound healing without infection will improve Outcome: Not Progressing

## 2022-09-06 NOTE — ED Notes (Signed)
ED TO INPATIENT HANDOFF REPORT  Name/Age/Gender Heather [name] 67 y.o. female  Code Status Code Status History     Date Active Date Inactive Code Status Order ID Comments User Context   08/26/2022 1732 08/27/2022 1521 Full Code [id number]  Selmer Dominion, NP Inpatient   08/26/2022 1730 08/26/2022 1732 Full Code [id number]  Marguerita Beards, MD Inpatient    Questions for Most Recent Historical Code Status (Order [id number])     Question Answer   By: Consent: discussion documented in EHR            Home/SNF/Other Home  Chief Complaint Pelvic abscess in female [N73.9]  Level of Care/Admitting Diagnosis ED Disposition     ED Disposition  Admit   Condition  --   Comment  Hospital Area: [facility] [[id number]]  Level of Care: Med-Surg [[id number]]  May admit patient to [facility] or [facility] if equivalent level of care is available:: Yes  Covid Evaluation: Asymptomatic - no recent exposure (last 10 days) testing not required  Diagnosis: Pelvic abscess in female [phone]  Admitting Physician: Jerene Bears [[id number]]  Attending Physician: Jerene Bears [[id number]]  Certification:: I certify this patient will need inpatient services for at least 2 midnights  Expected Medical Readiness: 09/09/2022          Medical History Past Medical History:  Diagnosis Date   Ambulates with cane    Family history of adverse reaction to anesthesia    mother has N & V   Headache    from tmj joint   Left spastic hemiparesis (HCC) 11/01/2018   diagnosed w/ MS in 2020, left leg weak from MS   Mitral valve prolapse    no problems   Multiple sclerosis (HCC) 12/07/2018   Follows w/ [facility], Dr. Joycelyn Schmid. treated w/ Ocrevus infusions every 6 months.   Prolapse of female pelvic organs    Stage IV. Follows w/ Dr. Florian Buff. Surgery scheduled for 08/26/22.   Wears partial dentures    1 tooth    Allergies Allergies  Allergen Reactions    Erythromycin Rash   Penicillins Rash    IV Location/Drains/Wounds Patient Lines/Drains/Airways Status     Active Line/Drains/Airways     Name Placement date Placement time Site Days   Peripheral IV 09/05/22 20 G Anterior;Distal;Left Forearm 09/05/22  1930  Forearm  1   Incision - 5 Ports Abdomen 1: Umbilicus 2: Lateral;Right 3: Lateral;Right;Mid 4: Left;Lateral 5: Left;Lateral;Mid 08/26/22  1301  -- 11            Labs/Imaging Results for orders placed or performed during the hospital encounter of 09/05/22 (from the past 48 hour(s))  Comprehensive metabolic panel     Status: Abnormal   Collection Time: 09/05/22  3:25 PM  Result Value Ref Range   Sodium 130 (L) 135 - 145 mmol/L   Potassium 3.4 (L) 3.5 - 5.1 mmol/L   Chloride 99 98 - 111 mmol/L   CO2 24 22 - 32 mmol/L   Glucose, Bld 117 (H) 70 - 99 mg/dL    Comment: Glucose reference range applies only to samples taken after fasting for at least 8 hours.   BUN 14 8 - 23 mg/dL   Creatinine, Ser 6.43 0.44 - 1.00 mg/dL   Calcium 8.3 (L) 8.9 - 10.3 mg/dL   Total Protein 6.8 6.5 - 8.1 g/dL   Albumin 3.6 3.5 - 5.0 g/dL   AST 15 15 - 41 U/L  ALT 19 0 - 44 U/L   Alkaline Phosphatase 67 38 - 126 U/L   Total Bilirubin 0.6 0.3 - 1.2 mg/dL   GFR, Estimated >25 >36 mL/min    Comment: (NOTE) Calculated using the CKD-EPI Creatinine Equation (2021)    Anion gap 7 5 - 15    Comment: Performed at [facility], [address], [place], [place] [address]  CBC with Differential     Status: Abnormal   Collection Time: 09/05/22  3:25 PM  Result Value Ref Range   WBC 13.9 (H) 4.0 - 10.5 K/uL   RBC 3.42 (L) 3.87 - 5.11 MIL/uL   Hemoglobin 10.9 (L) 12.0 - 15.0 g/dL   HCT 47.4 (L) 25.9 - 56.3 %   MCV 93.0 80.0 - 100.0 fL   MCH 31.9 26.0 - 34.0 pg   MCHC 34.3 30.0 - 36.0 g/dL   RDW 87.5 64.3 - 32.9 %   Platelets 295 150 - 400 K/uL   nRBC 0.0 0.0 - 0.2 %   Neutrophils Relative % 93 %   Neutro Abs 12.8 (H) 1.7 - 7.7 K/uL    Lymphocytes Relative 2 %   Lymphs Abs 0.3 (L) 0.7 - 4.0 K/uL   Monocytes Relative 5 %   Monocytes Absolute 0.8 0.1 - 1.0 K/uL   Eosinophils Relative 0 %   Eosinophils Absolute 0.0 0.0 - 0.5 K/uL   Basophils Relative 0 %   Basophils Absolute 0.0 0.0 - 0.1 K/uL   Immature Granulocytes 0 %   Abs Immature Granulocytes 0.05 0.00 - 0.07 K/uL    Comment: Performed at [facility], [address], [place], [place] [address]  Protime-INR     Status: None   Collection Time: 09/05/22  5:14 PM  Result Value Ref Range   Prothrombin Time 14.3 11.4 - 15.2 seconds   INR 1.1 0.8 - 1.2    Comment: (NOTE) INR goal varies based on device and disease states. Performed at [facility], [address] [place], [place] [address]   APTT     Status: None   Collection Time: 09/05/22  5:14 PM  Result Value Ref Range   aPTT 25 24 - 36 seconds    Comment: Performed at [facility], [address] [place], [place] [address]  I-Stat Lactic Acid, ED     Status: None   Collection Time: 09/05/22  5:24 PM  Result Value Ref Range   Lactic Acid, Venous 0.6 0.5 - 1.9 mmol/L  Resp panel by RT-PCR (RSV, Flu A&B, Covid) Anterior Nasal Swab     Status: None   Collection Time: 09/05/22  5:29 PM   Specimen: Anterior Nasal Swab  Result Value Ref Range   SARS Coronavirus 2 by RT PCR NEGATIVE NEGATIVE    Comment: (NOTE) SARS-CoV-2 target nucleic acids are NOT DETECTED.  The SARS-CoV-2 RNA is generally detectable in upper respiratory specimens during the acute phase of infection. The lowest concentration of SARS-CoV-2 viral copies this assay can detect is 138 copies/mL. A negative result does not preclude SARS-Cov-2 infection and should not be used as the sole basis for treatment or other patient management decisions. A negative result may occur with  improper specimen collection/handling, submission of specimen other than nasopharyngeal swab, presence of  viral mutation(s) within the areas targeted by this assay, and inadequate number of viral copies(<138 copies/mL). A negative result must be combined with clinical observations, patient history, and epidemiological information. The expected result  is Negative.  Fact Sheet for Patients:  BloggerCourse.com  Fact Sheet for Healthcare Providers:  SeriousBroker.it  This test is no t yet approved or cleared by the [place] FDA and  has been authorized for detection and/or diagnosis of SARS-CoV-2 by FDA under an Emergency Use Authorization (EUA). This EUA will remain  in effect (meaning this test can be used) for the duration of the COVID-19 declaration under Section 564(b)(1) of the Act, 21 U.S.C.section 360bbb-3(b)(1), unless the authorization is terminated  or revoked sooner.       Influenza A by PCR NEGATIVE NEGATIVE   Influenza B by PCR NEGATIVE NEGATIVE    Comment: (NOTE) The Xpert Xpress SARS-CoV-2/FLU/RSV plus assay is intended as an aid in the diagnosis of influenza from Nasopharyngeal swab specimens and should not be used as a sole basis for treatment. Nasal washings and aspirates are unacceptable for Xpert Xpress SARS-CoV-2/FLU/RSV testing.  Fact Sheet for Patients: BloggerCourse.com  Fact Sheet for Healthcare Providers: SeriousBroker.it  This test is not yet approved or cleared by the [place] FDA and has been authorized for detection and/or diagnosis of SARS-CoV-2 by FDA under an Emergency Use Authorization (EUA). This EUA will remain in effect (meaning this test can be used) for the duration of the COVID-19 declaration under Section 564(b)(1) of the Act, 21 U.S.C. section 360bbb-3(b)(1), unless the authorization is terminated or revoked.     Resp Syncytial Virus by PCR NEGATIVE NEGATIVE    Comment: (NOTE) Fact Sheet for  Patients: BloggerCourse.com  Fact Sheet for Healthcare Providers: SeriousBroker.it  This test is not yet approved or cleared by the [place] FDA and has been authorized for detection and/or diagnosis of SARS-CoV-2 by FDA under an Emergency Use Authorization (EUA). This EUA will remain in effect (meaning this test can be used) for the duration of the COVID-19 declaration under Section 564(b)(1) of the Act, 21 U.S.C. section 360bbb-3(b)(1), unless the authorization is terminated or revoked.  Performed at [facility], [address] [place], [place] [address]   Body fluid culture w Gram Stain     Status: None (Preliminary result)   Collection Time: 09/05/22  5:47 PM   Specimen: Body Fluid  Result Value Ref Range   Specimen Description      FLUID Performed at [facility], [address] [place], [place] [address]    Special Requests      NONE Performed at [facility], [address] [place], [place] [address]    Gram Stain      RARE WBC PRESENT,BOTH PMN AND MONONUCLEAR FEW GRAM POSITIVE COCCI FEW GRAM NEGATIVE RODS RARE GRAM POSITIVE RODS Performed at [facility], [address] [place], [place] [address]    Culture PENDING    Report Status PENDING   Blood Culture (routine x 2)     Status: None (Preliminary result)   Collection Time: 09/05/22  7:29 PM   Specimen: BLOOD LEFT FOREARM  Result Value Ref Range   Specimen Description      BLOOD LEFT FOREARM Performed at [facility], [address] [place], [place] [address]    Special Requests      BOTTLES DRAWN AEROBIC AND ANAEROBIC Blood Culture adequate volume Performed at [facility], [address] [place], [place] [address]    Culture PENDING    Report Status PENDING   Urinalysis, w/ Reflex to Culture (Infection Suspected) -Urine, Clean Catch     Status:  Abnormal    Collection Time: 09/05/22  8:42 PM  Result Value Ref Range   Specimen Source URINE, CLEAN CATCH    Color, Urine STRAW (A) YELLOW   APPearance CLEAR CLEAR   Specific Gravity, Urine 1.015 1.005 - 1.030   pH 6.0 5.0 - 8.0   Glucose, UA NEGATIVE NEGATIVE mg/dL   Hgb urine dipstick MODERATE (A) NEGATIVE   Bilirubin Urine NEGATIVE NEGATIVE   Ketones, ur 20 (A) NEGATIVE mg/dL   Protein, ur NEGATIVE NEGATIVE mg/dL   Nitrite NEGATIVE NEGATIVE   Leukocytes,Ua SMALL (A) NEGATIVE   RBC / HPF 0-5 0 - 5 RBC/hpf   WBC, UA 11-20 0 - 5 WBC/hpf    Comment:        Reflex urine culture not performed if WBC <=10, OR if Squamous epithelial cells >5. If Squamous epithelial cells >5 suggest recollection.    Bacteria, UA NONE SEEN NONE SEEN   Squamous Epithelial / HPF 0-5 0 - 5 /HPF    Comment: Performed at [facility], [address], [place], [place] [address]   CT ABDOMEN PELVIS W CONTRAST  Addendum Date: 09/05/2022   ADDENDUM REPORT: 09/05/2022 20:50 ADDENDUM: Not mentioned above, there is a small volume of scattered nonlocalizing fluid in the pelvis. There is no free hemorrhage. No free air is seen. Electronically Signed   By: Almira Bar M.D.   On: 09/05/2022 20:50   Result Date: 09/05/2022 CLINICAL DATA:  The patient underwent robotic assisted total hysterectomy with sacrocolpopexy, rectocele repair, and bladder suspension surgery 10 days ago, presenting today with abdominal pain postoperatively, fever, fatigue and nausea. The patient indicates ibuprofen somewhat relieves symptoms. EXAM: CT ABDOMEN AND PELVIS WITH CONTRAST TECHNIQUE: Multidetector CT imaging of the abdomen and pelvis was performed using the standard protocol following bolus administration of intravenous contrast. RADIATION DOSE REDUCTION: This exam was performed according to the departmental dose-optimization program which includes automated exposure control, adjustment of the mA and/or kV according to patient size  and/or use of iterative reconstruction technique. CONTRAST:  OMNIPAQUE IOHEXOL 300 MG/ML  SOLN COMPARISON:  None. FINDINGS: Lower chest: There is left lower lobe lateral basal linear scarring or atelectasis. Additional linear scarring in the right middle lobe base. Lung bases are clear of infiltrates. There are 2 right lower lobe pleural-based nodules each measuring 6 mm adjacent the posterior crest of the right hemidiaphragm on 5:28 and 29. Lung bases are otherwise clear.  The cardiac size is normal. Hepatobiliary: No focal liver abnormality is seen. Status post cholecystectomy. No biliary dilatation. Pancreas: No focal abnormality. Spleen: No abnormality.  No splenomegaly. Adrenals/Urinary Tract: There is no adrenal mass. Both kidneys enhance homogeneously without mass. 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 Bilateral extrarenal pelves. There is no urinary stone or obstruction. Bladder appears normal for the degree of distention. Stomach/Bowel: Unremarkable contracted stomach. 2.4 cm diverticulum of the descending duodenum. The unopacified small bowel is otherwise unremarkable. The retrocecal appendix is normal and well visible. Scattered colonic diverticula noted without diverticulitis. Vascular/Lymphatic: Aortic atherosclerosis. No enlarged abdominal or pelvic lymph nodes. Reproductive: Surgically absent uterus.  No adnexal masses seen. Other: In the right anterior pelvic floor, there is complex heterogeneous mixed density collection in between the dorsal aspect of the right pubic bone and the right anterolateral bladder wall, deforming the adjacent bladder, Hounsfield density is 40 consistent with viscous complex fluid or phlegmon. The collection measures 4.8 cm AP and 2.3 cm transverse on 2:74, and 4.1 cm craniocaudal on  7:68. There are some stranding changes around this. There is a rim enhancing linear fistulous tract extending from the collection inferiorly into the lower vaginal canal,  best seen on series 2 axial 79-82 and coronal reconstruction series 7 images 70-80. 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 There is no visible fistula into the bladder, and no air in the bladder. Musculoskeletal: There is a chronic appearing mild inferior endplate compression fracture of the L3 vertebral body, with 20-25% vertebral height loss. There is advanced facet hypertrophy at L4-5 with grade 1 L4-5 spondylolisthesis. Osteopenia and mild degenerative changes noted elsewhere with no acute or other significant osseous findings. There is no evidence of right pubic bone osteomyelitis along side the collection. IMPRESSION: 1. 4.8 x 2.3 x 4.1 cm complex collection in the right anterior pelvic floor in between the dorsal aspect of the right pubic bone and the right anterolateral bladder wall, with a rim enhancing linear fistulous tract extending from the collection inferiorly into the lower vaginal canal. 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 2. There is no visible fistula into the bladder, and no air in the bladder. The bladder wall is deformed by the collection. 3. Two 6 mm pleural-based nodules in the right lower lobe. Per Fleischner Society Guidelines, recommend a non-contrast Chest CT at 3-6 months, then consider another non-contrast Chest CT at 18-24 months. If patient is low risk for malignancy, non-contrast Chest CT at 18-24 months is optional. These guidelines do not apply to immunocompromised patients and patients with cancer. Follow up in patients with significant comorbidities as clinically warranted. For lung cancer screening, adhere to Lung-RADS guidelines. Reference: Radiology. 2017; 284(1):228-43. 4. Aortic atherosclerosis. 5. Diverticulosis without evidence of diverticulitis. 6. Chronic appearing mild inferior endplate compression fracture of the L3 vertebral body. 7. Osteopenia and degenerative change. Aortic Atherosclerosis (ICD10-I70.0).  Electronically Signed: By: Almira Bar M.D. On: 09/05/2022 20:30   DG Chest 2 View  Result Date: 09/05/2022 CLINICAL DATA:  Fever. EXAM: CHEST - 2 VIEW COMPARISON:  None Available. FINDINGS: The heart size and mediastinal contours are within normal limits. Both lungs are clear. Moderate compression deformity of upper thoracic vertebral body is noted most consistent with old fracture. IMPRESSION: No active cardiopulmonary disease. Electronically Signed   By: Lupita Raider M.D.   On: 09/05/2022 16:09    Pending Labs Unresulted Labs (From admission, onward)     Start     Ordered   09/05/22 2042  Urine Culture  Once,   R        09/05/22 2042   09/05/22 1547  Blood Culture (routine x 2)  (Septic presentation on arrival (screening labs, nursing and treatment orders for obvious sepsis))  BLOOD CULTURE X 2,   STAT      09/05/22 1546   Signed and Held  Comprehensive metabolic panel  Daily,   R      Signed and Held   Signed and Held  CBC with Differential/Platelet  Daily,   R      Signed and Held            Vitals/Pain Today's Vitals   09/05/22 1501 09/05/22 1502 09/05/22 1932 09/05/22 2339  BP:   121/73 119/75  Pulse:   99 (!) 114  Resp:   16 (!) 22  Temp:   98.2 F (36.8 C) 99 F (37.2 C)  TempSrc:    Oral  SpO2:   99% 97%  Weight:  62.6 kg    Height:  5\' 3"  (1.6 m)    PainSc: 0-No pain       Isolation Precautions No active isolations  Medications Medications  lactated ringers infusion ( Intravenous New Bag/Given 09/05/22 1933)  ibuprofen (ADVIL) tablet 600 mg (has no administration in time range)  ibuprofen (ADVIL) tablet 800 mg (800 mg Oral Given 09/05/22 1505)  lactated ringers bolus 1,000 mL (0 mLs Intravenous Stopped 09/05/22 2148)  iohexol (OMNIPAQUE) 300 MG/ML solution 100 mL (100 mLs Intravenous Contrast Given 09/05/22 1945)  metroNIDAZOLE (FLAGYL) IVPB 500 mg (0 mg Intravenous Stopped 09/05/22 2259)  cefTRIAXone (ROCEPHIN) 2 g in sodium chloride 0.9 % 100 mL IVPB  (0 g Intravenous Stopped 09/05/22 2259)    Mobility walks with person assist

## 2022-09-06 NOTE — Progress Notes (Signed)
This patient  has MS.  says she takes Baclofen at home it's on her PTA med profile.  She takes 5 mg twice a day for a total of 10 mg per day. She would like to take 5 mg tonight.

## 2022-09-07 DIAGNOSIS — N739 Female pelvic inflammatory disease, unspecified: Secondary | ICD-10-CM

## 2022-09-07 DIAGNOSIS — T8142XA Infection following a procedure, deep incisional surgical site, initial encounter: Secondary | ICD-10-CM

## 2022-09-07 DIAGNOSIS — E876 Hypokalemia: Secondary | ICD-10-CM

## 2022-09-07 DIAGNOSIS — D649 Anemia, unspecified: Secondary | ICD-10-CM

## 2022-09-07 LAB — CBC WITH DIFFERENTIAL/PLATELET
Abs Immature Granulocytes: 0.01 10*3/uL (ref 0.00–0.07)
Basophils Absolute: 0.1 10*3/uL (ref 0.0–0.1)
Basophils Relative: 1 %
Eosinophils Absolute: 0.1 10*3/uL (ref 0.0–0.5)
Eosinophils Relative: 2 %
HCT: 28.9 % — ABNORMAL LOW (ref 36.0–46.0)
Hemoglobin: 9.4 g/dL — ABNORMAL LOW (ref 12.0–15.0)
Immature Granulocytes: 0 %
Lymphocytes Relative: 11 %
Lymphs Abs: 0.6 10*3/uL — ABNORMAL LOW (ref 0.7–4.0)
MCH: 31.1 pg (ref 26.0–34.0)
MCHC: 32.5 g/dL (ref 30.0–36.0)
MCV: 95.7 fL (ref 80.0–100.0)
Monocytes Absolute: 0.9 10*3/uL (ref 0.1–1.0)
Monocytes Relative: 15 %
Neutro Abs: 4.1 10*3/uL (ref 1.7–7.7)
Neutrophils Relative %: 71 %
Platelets: 266 10*3/uL (ref 150–400)
RBC: 3.02 MIL/uL — ABNORMAL LOW (ref 3.87–5.11)
RDW: 12.6 % (ref 11.5–15.5)
WBC: 5.8 10*3/uL (ref 4.0–10.5)
nRBC: 0 % (ref 0.0–0.2)

## 2022-09-07 LAB — COMPREHENSIVE METABOLIC PANEL
ALT: 19 U/L (ref 0–44)
AST: 17 U/L (ref 15–41)
Albumin: 2.8 g/dL — ABNORMAL LOW (ref 3.5–5.0)
Alkaline Phosphatase: 55 U/L (ref 38–126)
Anion gap: 6 (ref 5–15)
BUN: 10 mg/dL (ref 8–23)
CO2: 26 mmol/L (ref 22–32)
Calcium: 8.2 mg/dL — ABNORMAL LOW (ref 8.9–10.3)
Chloride: 106 mmol/L (ref 98–111)
Creatinine, Ser: 0.62 mg/dL (ref 0.44–1.00)
GFR, Estimated: >60 mL/min (ref >60)
Glucose, Bld: 120 mg/dL — ABNORMAL HIGH (ref 70–99)
Potassium: 4.2 mmol/L (ref 3.5–5.1)
Sodium: 138 mmol/L (ref 135–145)
Total Bilirubin: 0.2 mg/dL — ABNORMAL LOW (ref 0.3–1.2)
Total Protein: 5.7 g/dL — ABNORMAL LOW (ref 6.5–8.1)

## 2022-09-07 MED ORDER — LACTATED RINGERS IV SOLN: INTRAVENOUS | Status: DC

## 2022-09-07 NOTE — Plan of Care (Signed)
  Problem: Pain Managment: Goal: General experience of comfort will improve Outcome: Progressing   

## 2022-09-07 NOTE — Plan of Care (Signed)

## 2022-09-07 NOTE — Progress Notes (Signed)
Subjective: Patient reports she is feeling really good.  Nausea has resolved.  Denies pain.  Asked for baclofen last night but she uses this for MS and had been off it for >24 hours.  She is having soft, pudding-like stools but no frank diarrhea.  There is some sediment in her urine with some small amount of blood.  She is having a little vaginal discharge.  Objective: I have reviewed patient's vital signs, intake and output, medications, and labs. Vitals:   09/06/22 2127 09/07/22 0539  BP: 115/62 112/65  Pulse: 80 86  Resp: 20 20  Temp: 98.6 F (37 C) 99.1 F (37.3 C)  SpO2: 99% 95%       Latest Ref Rng & Units 09/07/2022    5:23 AM 09/06/2022   11:56 AM 09/05/2022    3:25 PM  CBC  WBC 4.0 - 10.5 K/uL 5.8  8.6  13.9   Hemoglobin 12.0 - 15.0 g/dL 9.4  9.3  40.9   Hematocrit 36.0 - 46.0 % 28.9  27.9  31.8   Platelets 150 - 400 K/uL 266  239  295       Latest Ref Rng & Units 09/07/2022    5:23 AM 09/06/2022   11:56 AM 09/05/2022    3:25 PM  CMP  Glucose 70 - 99 mg/dL 811  914  782   BUN 8 - 23 mg/dL 10  13  14    Creatinine 0.44 - 1.00 mg/dL 9.56  2.13  0.86   Sodium 135 - 145 mmol/L 138  134  130   Potassium 3.5 - 5.1 mmol/L 4.2  2.9  3.4   Chloride 98 - 111 mmol/L 106  103  99   CO2 22 - 32 mmol/L 26  23  24    Calcium 8.9 - 10.3 mg/dL 8.2  7.8  8.3   Total Protein 6.5 - 8.1 g/dL 5.7  5.5  6.8   Total Bilirubin 0.3 - 1.2 mg/dL 0.2  0.5  0.6   Alkaline Phos 38 - 126 U/L 55  61  67   AST 15 - 41 U/L 17  16  15    ALT 0 - 44 U/L 19  18  19      General: alert, cooperative, and no distress Resp: clear to auscultation bilaterally Cardio: regular rate and rhythm, S1, S2 normal, no murmur, click, rub or gallop GI: normal findings: soft, decreased distension, +BS all quadrants and incision: clean, dry, and intact Extremities: extremities normal, atraumatic, no cyanosis or edema Vaginal Bleeding: none Speculum not available but digital vaginal exam with cuff intact, small amount of  discharge noted on glove.   Assessment/Plan: 1) post op pelvic abscess  - wbc ct normalized - continue IV antibiotics  - plan repeat CT scan tomorrow.  If improved, will d/c home with oral antibiotics and outpatient follow up  2) anemia  - stable on CBC today  - repeat tomorrow  3) hypokalemia   - resolved.  Will remove potassium from fluids  - repeat CMP in AM   LOS: 1 day    Jerene Bears, MD 09/07/2022, 8:38 AM

## 2022-09-08 ENCOUNTER — Inpatient Hospital Stay (HOSPITAL_COMMUNITY): Payer: PPO

## 2022-09-08 LAB — COMPREHENSIVE METABOLIC PANEL
ALT: 17 U/L (ref 0–44)
AST: 14 U/L — ABNORMAL LOW (ref 15–41)
Albumin: 2.8 g/dL — ABNORMAL LOW (ref 3.5–5.0)
Alkaline Phosphatase: 55 U/L (ref 38–126)
Anion gap: 6 (ref 5–15)
BUN: 5 mg/dL — ABNORMAL LOW (ref 8–23)
CO2: 26 mmol/L (ref 22–32)
Calcium: 8.4 mg/dL — ABNORMAL LOW (ref 8.9–10.3)
Chloride: 105 mmol/L (ref 98–111)
Creatinine, Ser: 0.54 mg/dL (ref 0.44–1.00)
GFR, Estimated: >60 mL/min (ref >60)
Glucose, Bld: 99 mg/dL (ref 70–99)
Potassium: 3.2 mmol/L — ABNORMAL LOW (ref 3.5–5.1)
Sodium: 137 mmol/L (ref 135–145)
Total Bilirubin: 0.2 mg/dL — ABNORMAL LOW (ref 0.3–1.2)
Total Protein: 5.6 g/dL — ABNORMAL LOW (ref 6.5–8.1)

## 2022-09-08 LAB — CBC WITH DIFFERENTIAL/PLATELET
Abs Immature Granulocytes: 0.02 10*3/uL (ref 0.00–0.07)
Basophils Absolute: 0.1 10*3/uL (ref 0.0–0.1)
Basophils Relative: 1 %
Eosinophils Absolute: 0.3 10*3/uL (ref 0.0–0.5)
Eosinophils Relative: 4 %
HCT: 29.1 % — ABNORMAL LOW (ref 36.0–46.0)
Hemoglobin: 9.5 g/dL — ABNORMAL LOW (ref 12.0–15.0)
Immature Granulocytes: 0 %
Lymphocytes Relative: 15 %
Lymphs Abs: 1 10*3/uL (ref 0.7–4.0)
MCH: 30.4 pg (ref 26.0–34.0)
MCHC: 32.6 g/dL (ref 30.0–36.0)
MCV: 93.3 fL (ref 80.0–100.0)
Monocytes Absolute: 1.1 10*3/uL — ABNORMAL HIGH (ref 0.1–1.0)
Monocytes Relative: 16 %
Neutro Abs: 4.4 10*3/uL (ref 1.7–7.7)
Neutrophils Relative %: 64 %
Platelets: 288 10*3/uL (ref 150–400)
RBC: 3.12 MIL/uL — ABNORMAL LOW (ref 3.87–5.11)
RDW: 12.8 % (ref 11.5–15.5)
WBC: 6.8 10*3/uL (ref 4.0–10.5)
nRBC: 0 % (ref 0.0–0.2)

## 2022-09-08 LAB — URINE CULTURE: Culture: <10000 — AB

## 2022-09-08 LAB — BODY FLUID CULTURE W GRAM STAIN

## 2022-09-08 MED ORDER — CEPHALEXIN 500 MG PO CAPS: 500.0000 mg | ORAL_CAPSULE | Freq: Four times a day (QID) | ORAL | 0 refills | Status: AC

## 2022-09-08 MED ORDER — POTASSIUM CHLORIDE 20 MEQ PO PACK
40.0000 meq | PACK | Freq: Once | ORAL | Status: AC
  Administered 2022-09-08: 40 meq via ORAL
  Filled 2022-09-08: qty 2

## 2022-09-08 MED ORDER — IOHEXOL 300 MG/ML  SOLN
100.0000 mL | Freq: Once | INTRAMUSCULAR | Status: AC | PRN
  Administered 2022-09-08: 100 mL via INTRAVENOUS

## 2022-09-08 MED ORDER — IOHEXOL 9 MG/ML PO SOLN
500.0000 mL | ORAL | Status: AC
  Administered 2022-09-08 (×2): 500 mL via ORAL

## 2022-09-08 MED ORDER — METRONIDAZOLE 500 MG PO TABS: 500.0000 mg | ORAL_TABLET | Freq: Two times a day (BID) | ORAL | 0 refills | Status: AC

## 2022-09-08 NOTE — Discharge Summary (Signed)
Physician Discharge Summary  Patient ID: Heather [name] MRN: [id number] DOB/AGE: 1955-12-17 67 y.o.  Admit date: 09/05/2022 Discharge date: 09/08/2022  Admission Diagnoses:  Discharge Diagnoses:  Principal Problem:   Pelvic abscess in female Active Problems:   Infection of deep incisional surgical site after procedure   Hypokalemia   Anemia   Discharged Condition: good  Hospital Course: 67yo s/p Robotic assisted total laparoscopic hysterectomy, bilateral salpingectomy, sacrocolpopexy (vertessa Lite Y), midurethral sling (Advantage Fit), cystoscopy, perineorrhaphy on 8/13. She presented to the ER with post operative fever. CT scan was performed which showed a pelvic fluid collection, likely early abscess. She was admitted for IV antibiotics (ceftriaxone and flagyl). After 48 hours CT scan was performed. Spoke with radiologist and agrees there is improvement in the scan. Pt is tolerating regular diet, ambulating, voiding and pain is controlled. She has had regular bowel movements. She was deemed stable for discharge.   Consults: None  Significant Diagnostic Studies: labs:     Latest Ref Rng & Units 09/08/2022    5:13 AM 09/07/2022    5:23 AM 09/06/2022   11:56 AM  CBC  WBC 4.0 - 10.5 K/uL 6.8  5.8  8.6   Hemoglobin 12.0 - 15.0 g/dL 9.5  9.4  9.3   Hematocrit 36.0 - 46.0 % 29.1  28.9  27.9   Platelets 150 - 400 K/uL 288  266  239    Lab Results  Component Value Date   NA 137 09/08/2022   K 3.2 (L) 09/08/2022   CL 105 09/08/2022   CO2 26 09/08/2022     Treatments: IV hydration and antibiotics: ceftriaxone and metronidazole  Discharge Exam: Blood pressure 112/68, pulse 80, temperature 98.2 F (36.8 C), temperature source Oral, resp. rate 16, height 5\' 3"  (1.6 m), weight 62.6 kg, SpO2 99%. General appearance: alert and cooperative Resp: clear to auscultation bilaterally Cardio: regular rate and rhythm, S1, S2 normal, no murmur, click, rub or gallop GI: soft, non-tender; bowel  sounds normal; no masses,  no organomegaly Extremities: extremities normal, atraumatic, no cyanosis or edema Laparoscopic incisions healing well and intact  Disposition: Discharge disposition: 01-Home or Self Care       Discharge Instructions     Call MD for:  persistant nausea and vomiting   Complete by: As directed    Call MD for:  redness, tenderness, or signs of infection (pain, swelling, redness, odor or green/yellow discharge around incision site)   Complete by: As directed    Call MD for:  severe uncontrolled pain   Complete by: As directed    Call MD for:  temperature >100.4   Complete by: As directed    Diet general   Complete by: As directed    Increase activity slowly   Complete by: As directed    May walk up steps   Complete by: As directed       Allergies as of 09/08/2022       Reactions   Erythromycin Rash   Penicillins Rash   Pt does not have allergies to cephalosporins        Medication List     TAKE these medications    ADVIL PO Take 400 mg by mouth 2 (two) times daily.   ibuprofen 600 MG tablet Commonly known as: ADVIL Take 1 tablet (600 mg total) by mouth every 6 (six) hours as needed.   ascorbic acid 500 MG tablet Commonly known as: VITAMIN C Take 1,000 mg by mouth daily.   baclofen 10  MG tablet Commonly known as: LIORESAL Take 0.5 tablets (5 mg total) by mouth 2 (two) times daily.   cephALEXin 500 MG capsule Commonly known as: KEFLEX Take 1 capsule (500 mg total) by mouth 4 (four) times daily for 7 days.   ELDERBERRY PO Take by mouth as needed. gummies   estradiol 0.1 MG/GM vaginal cream Commonly known as: ESTRACE Place 0.5 g vaginally 2 (two) times a week. Place 0.5g nightly for two weeks then twice a week after   metroNIDAZOLE 500 MG tablet Commonly known as: Flagyl Take 1 tablet (500 mg total) by mouth 2 (two) times daily for 7 days.   multivitamin capsule Take 1 capsule by mouth daily.   Ocrevus 300 MG/10ML  injection Generic drug: ocrelizumab Inject 600 mg into the vein. 300mg IV on day 1 &15. 600mg  IV q 6 months thereafter. [facility].   polyethylene glycol powder 17 GM/SCOOP powder Commonly known as: GLYCOLAX/MIRALAX Take 17 g by mouth daily. Drink 17g (1 scoop) dissolved in water per day.   Vitamin D 50 MCG (2000 UT) tablet Take 2,000 Units by mouth daily.         Signed: Marguerita Beards 09/08/2022, 2:58 PM

## 2022-09-08 NOTE — Progress Notes (Signed)
Subjective: Feeling well overall, no pain.  Stool is still soft but not loose.  Has noticed a small amount of vaginal bleeding but no discharge. She is tolerating regular diet and ambulating well.   Objective: I have reviewed patient's vital signs, intake and output, medications, and labs. Vitals:   09/08/22 0531 09/08/22 1345  BP: 122/65 112/68  Pulse: 83 80  Resp: 17 16  Temp: 98.7 F (37.1 C) 98.2 F (36.8 C)  SpO2: 97% 99%       Latest Ref Rng & Units 09/08/2022    5:13 AM 09/07/2022    5:23 AM 09/06/2022   11:56 AM  CBC  WBC 4.0 - 10.5 K/uL 6.8  5.8  8.6   Hemoglobin 12.0 - 15.0 g/dL 9.5  9.4  9.3   Hematocrit 36.0 - 46.0 % 29.1  28.9  27.9   Platelets 150 - 400 K/uL 288  266  239       Latest Ref Rng & Units 09/08/2022    5:13 AM 09/07/2022    5:23 AM 09/06/2022   11:56 AM  CMP  Glucose 70 - 99 mg/dL 99  191  478   BUN 8 - 23 mg/dL 5  10  13    Creatinine 0.44 - 1.00 mg/dL 2.95  6.21  3.08   Sodium 135 - 145 mmol/L 137  138  134   Potassium 3.5 - 5.1 mmol/L 3.2  4.2  2.9   Chloride 98 - 111 mmol/L 105  106  103   CO2 22 - 32 mmol/L 26  26  23    Calcium 8.9 - 10.3 mg/dL 8.4  8.2  7.8   Total Protein 6.5 - 8.1 g/dL 5.6  5.7  5.5   Total Bilirubin 0.3 - 1.2 mg/dL 0.2  0.2  0.5   Alkaline Phos 38 - 126 U/L 55  55  61   AST 15 - 41 U/L 14  17  16    ALT 0 - 44 U/L 17  19  18      General: alert, cooperative, and no distress Resp: clear to auscultation bilaterally Cardio: regular rate and rhythm, S1, S2 normal, no murmur, click, rub or gallop GI: normal findings: soft, decreased distension, +BS all quadrants and incision: clean, dry, and intact Extremities: extremities normal, atraumatic, no cyanosis or edema    Assessment/Plan: 1) post op pelvic abscess  - wbc ct normalized - continue IV antibiotics  - CT ordered for today. If collection has resolved, can plan to discharge with PO antibiotics  2) anemia  - stable on CBC today  3) hypokalemia   - replete with  40 mEq KCl   LOS: 2 days    Marguerita Beards, MD 09/08/2022, 2:11 PM

## 2022-09-08 NOTE — Progress Notes (Signed)
Patient discharged home, IV removed, discharge paperwork explained to patient and patient verbalized understanding, awaiting patient's family member to pick her up.

## 2022-09-08 NOTE — Progress Notes (Signed)
   09/08/22 1108  TOC Brief Assessment  Insurance and Status Reviewed  Patient has primary care physician Yes  Home environment has been reviewed home  Prior level of function: independent  Prior/Current Home Services No current home services  Social Determinants of Health Reivew SDOH reviewed no interventions necessary  Readmission risk has been reviewed Yes  Transition of care needs no transition of care needs at this time

## 2022-09-10 LAB — CULTURE, BLOOD (ROUTINE X 2)
Culture: NO GROWTH
Special Requests: ADEQUATE

## 2022-09-12 LAB — CULTURE, BLOOD (ROUTINE X 2): Special Requests: ADEQUATE

## 2022-10-14 ENCOUNTER — Encounter: Payer: Self-pay | Admitting: Obstetrics and Gynecology

## 2022-10-14 ENCOUNTER — Ambulatory Visit (INDEPENDENT_AMBULATORY_CARE_PROVIDER_SITE_OTHER): Payer: PPO | Admitting: Obstetrics and Gynecology

## 2022-10-14 VITALS — BP 133/87 | HR 74

## 2022-10-14 DIAGNOSIS — Z48816 Encounter for surgical aftercare following surgery on the genitourinary system: Secondary | ICD-10-CM

## 2022-10-14 DIAGNOSIS — N3941 Urge incontinence: Secondary | ICD-10-CM

## 2022-10-14 DIAGNOSIS — Z9889 Other specified postprocedural states: Secondary | ICD-10-CM

## 2022-10-14 NOTE — Progress Notes (Signed)
[facility]  Date of Visit: 10/14/2022  History of Present Illness: Heather [name] is a 67 y.o. female scheduled today for a post-operative visit.   Surgery: s/p s/p Robotic assisted total laparoscopic hysterectomy, bilateral salpingectomy, sacrocolpopexy (vertessa Lite Y), midurethral sling (Advantage Fit), cystoscopy, perineorrhaphy on 08/26/22   She passed her postoperative void trial.   Postoperative course complicated by retropubic pelvic abscess, treated with IV antibiotics.  Today she reports she is feeling well. Noticed a small speck of brown with some stitches.   UTI in the last 6 weeks? No  Pain? No  She has returned to her normal activity (except for postop restrictions) Vaginal bulge? No  Stress incontinence: No  Urgency/frequency: No  Urge incontinence: Yes - some but has been improving. Voiding dysfunction: No  Bowel issues: No   Subjective Success: Do you usually have a bulge or something falling out that you can see or feel in the vaginal area? No  Retreatment Success: Any retreatment with surgery or pessary for any compartment? No   Pathology results:  CERVIX, UTERUS, BILATERAL FALLOPIAN TUBES AND OVARIES, RESECTION: Cervix:           Acute and chronic cervicitis with reactive changes.           Negative for dysplasia or malignancy.       Endocervix:           Nabothian cysts.           Negative for hyperplasia, atypia or malignancy.       Endometrium:           Inactive endometrium.           Negative for hyperplasia or malignancy.       Myometrium:           Unremarkable.           Negative for malignancy.       Serosa:           Unremarkable.           Negative for malignancy.       Bilateral fallopian tubes:           Benign fimbriated fallopian tubes with paratubal cysts.           Negative for malignancy.  Bilateral ovaries:      Benign ovarian parenchyma without significant pathologic alteration.      Negative for malignancy.    Medications: She has a current medication list which includes the following prescription(s): ascorbic acid, baclofen, vitamin d, elderberry, estradiol, multivitamin, and ocrevus.   Allergies: Patient is allergic to erythromycin and penicillins.   Physical Exam: BP 133/87   Pulse 74   Abdomen: soft, non-tender, without masses or organomegaly Laparoscopic and suprapubic Incisions: healing well.  Pelvic Examination: Vagina: Incisions healing well. Sutures are present at the cuff and there is not granulation tissue. No tenderness along the anterior or posterior vagina. No apical tenderness. No pelvic masses. No visible or palpable mesh.  POP-Q: POP-Q  -3                                            Aa   -3  Ba  -8                                              C   2.5                                            Gh  4.5                                            Pb  8                                            tvl   -3                                            Ap  -3                                            Bp                                                 D     ---------------------------------------------------------  Assessment and Plan:  1. Post-operative state   2. Urge incontinence     - Pathology results were reviewed with the patient today and she verbalized understanding that the results were benign.  - Can resume regular activity. Wait an additional 6 weeks prior to intercourse - Discussed avoidance of heavy lifting and straining long term to reduce the risk of recurrence.  - Urgency not bothersome to her. We discussed starting medication if symptoms worsen.  - Continue estrace cream twice weekly for vaginal dryness  Follow up as needed  Marguerita Beards, MD

## 2022-10-14 NOTE — Patient Instructions (Addendum)
Ok for intercourse the second week of November.   Avoid heavy lifting and straining.   Use vaginal estrogen twice a week for vaginal dryness.

## 2022-10-20 DIAGNOSIS — F4323 Adjustment disorder with mixed anxiety and depressed mood: Secondary | ICD-10-CM | POA: Diagnosis not present

## 2022-10-22 DIAGNOSIS — G35 Multiple sclerosis: Secondary | ICD-10-CM | POA: Diagnosis not present

## 2022-11-03 DIAGNOSIS — F4323 Adjustment disorder with mixed anxiety and depressed mood: Secondary | ICD-10-CM | POA: Diagnosis not present

## 2022-11-13 DIAGNOSIS — F4323 Adjustment disorder with mixed anxiety and depressed mood: Secondary | ICD-10-CM | POA: Diagnosis not present

## 2022-11-27 DIAGNOSIS — F4323 Adjustment disorder with mixed anxiety and depressed mood: Secondary | ICD-10-CM | POA: Diagnosis not present

## 2022-12-09 DIAGNOSIS — F4323 Adjustment disorder with mixed anxiety and depressed mood: Secondary | ICD-10-CM | POA: Diagnosis not present

## 2022-12-24 DIAGNOSIS — F4323 Adjustment disorder with mixed anxiety and depressed mood: Secondary | ICD-10-CM | POA: Diagnosis not present

## 2023-01-01 DIAGNOSIS — F4323 Adjustment disorder with mixed anxiety and depressed mood: Secondary | ICD-10-CM | POA: Diagnosis not present

## 2023-01-05 DIAGNOSIS — S40819A Abrasion of unspecified upper arm, initial encounter: Secondary | ICD-10-CM | POA: Diagnosis not present

## 2023-01-13 DIAGNOSIS — F4323 Adjustment disorder with mixed anxiety and depressed mood: Secondary | ICD-10-CM | POA: Diagnosis not present

## 2023-01-15 ENCOUNTER — Telehealth: Payer: Self-pay | Admitting: Neurology

## 2023-01-15 MED ORDER — BACLOFEN 10 MG PO TABS: 5.0000 mg | ORAL_TABLET | Freq: Two times a day (BID) | ORAL | 0 refills | Status: DC

## 2023-01-15 NOTE — Telephone Encounter (Signed)
 Called pt and let her know that per Dr Marjory Lies, 2 week Baclofen supply (14 tabs) has been sent to [facility] She was appreciative.

## 2023-01-15 NOTE — Addendum Note (Signed)
 Addended by: Bertram Savin on: 01/15/2023 04:01 PM   Modules accepted: Orders

## 2023-01-15 NOTE — Telephone Encounter (Signed)
 Know if can  get a refill for baclofen (LIORESAL) 10 MG tablet without being time for a refill. Have left the home and get not get medication when left the home. Would like a call back to discuss in more detail,.

## 2023-01-15 NOTE — Telephone Encounter (Addendum)
 Spoke with patient. Unfortunately she had to leave her home/husband quickly and didn't get her medication. The last fill was in November (90 day supply). Pt is asking if she can at least have 1-2 weeks worth until she can get back in the home to get the Baclofen .

## 2023-01-19 DIAGNOSIS — F4323 Adjustment disorder with mixed anxiety and depressed mood: Secondary | ICD-10-CM | POA: Diagnosis not present

## 2023-01-20 DIAGNOSIS — F4323 Adjustment disorder with mixed anxiety and depressed mood: Secondary | ICD-10-CM | POA: Diagnosis not present

## 2023-01-29 DIAGNOSIS — R21 Rash and other nonspecific skin eruption: Secondary | ICD-10-CM | POA: Diagnosis not present

## 2023-01-29 DIAGNOSIS — Z6823 Body mass index (BMI) 23.0-23.9, adult: Secondary | ICD-10-CM | POA: Diagnosis not present

## 2023-02-12 ENCOUNTER — Encounter: Payer: Self-pay | Admitting: Neurology

## 2023-02-12 ENCOUNTER — Ambulatory Visit: Payer: PPO | Admitting: Neurology

## 2023-02-12 VITALS — BP 136/77 | HR 65 | Ht 63.0 in | Wt 144.0 lb

## 2023-02-12 DIAGNOSIS — G8114 Spastic hemiplegia affecting left nondominant side: Secondary | ICD-10-CM

## 2023-02-12 DIAGNOSIS — G35 Multiple sclerosis: Secondary | ICD-10-CM

## 2023-02-12 MED ORDER — BACLOFEN 10 MG PO TABS: 5.0000 mg | ORAL_TABLET | Freq: Two times a day (BID) | ORAL | 3 refills | Status: DC
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
## 2023-02-12 NOTE — Patient Instructions (Signed)
We will continue Ocrevus.  Check routine labs today.  Continue baclofen.  Recommend continue exercise, activity.  Follow-up in 6 months.

## 2023-02-13 LAB — COMPREHENSIVE METABOLIC PANEL
ALT: 26 [IU]/L (ref 0–32)
AST: 21 [IU]/L (ref 0–40)
Albumin: 4.6 g/dL (ref 3.9–4.9)
Alkaline Phosphatase: 83 [IU]/L (ref 44–121)
BUN/Creatinine Ratio: 20 (ref 12–28)
BUN: 14 mg/dL (ref 8–27)
Bilirubin Total: 0.5 mg/dL (ref 0.0–1.2)
CO2: 24 mmol/L (ref 20–29)
Calcium: 10.1 mg/dL (ref 8.7–10.3)
Chloride: 103 mmol/L (ref 96–106)
Creatinine, Ser: 0.7 mg/dL (ref 0.57–1.00)
Globulin, Total: 2.3 g/dL (ref 1.5–4.5)
Glucose: 87 mg/dL (ref 70–99)
Potassium: 4.5 mmol/L (ref 3.5–5.2)
Sodium: 144 mmol/L (ref 134–144)
Total Protein: 6.9 g/dL (ref 6.0–8.5)
eGFR: 94 mL/min/{1.73_m2} (ref >59)

## 2023-02-13 LAB — CBC WITH DIFFERENTIAL/PLATELET
Basophils Absolute: 0.1 10*3/uL (ref 0.0–0.2)
Basos: 1 %
EOS (ABSOLUTE): 0.1 10*3/uL (ref 0.0–0.4)
Eos: 1 %
Hematocrit: 42.8 % (ref 34.0–46.6)
Hemoglobin: 14 g/dL (ref 11.1–15.9)
Immature Grans (Abs): 0 10*3/uL (ref 0.0–0.1)
Immature Granulocytes: 0 %
Lymphocytes Absolute: 0.9 10*3/uL (ref 0.7–3.1)
Lymphs: 13 %
MCH: 31.5 pg (ref 26.6–33.0)
MCHC: 32.7 g/dL (ref 31.5–35.7)
MCV: 96 fL (ref 79–97)
Monocytes Absolute: 0.9 10*3/uL (ref 0.1–0.9)
Monocytes: 13 %
Neutrophils Absolute: 5.1 10*3/uL (ref 1.4–7.0)
Neutrophils: 72 %
Platelets: 264 10*3/uL (ref 150–450)
RBC: 4.45 x10E6/uL (ref 3.77–5.28)
RDW: 11.9 % (ref 11.7–15.4)
WBC: 7.1 10*3/uL (ref 3.4–10.8)

## 2023-02-13 LAB — IGG, IGA, IGM
IgA/Immunoglobulin A, Serum: 150 mg/dL (ref 87–352)
IgG (Immunoglobin G), Serum: 1018 mg/dL (ref 586–1602)
IgM (Immunoglobulin M), Srm: 43 mg/dL (ref 26–217)

## 2023-02-16 ENCOUNTER — Encounter: Payer: Self-pay | Admitting: Neurology

## 2023-02-16 ENCOUNTER — Telehealth: Payer: Self-pay | Admitting: Neurology

## 2023-02-16 DIAGNOSIS — F4323 Adjustment disorder with mixed anxiety and depressed mood: Secondary | ICD-10-CM | POA: Diagnosis not present

## 2023-02-16 NOTE — Telephone Encounter (Signed)
Pt said since mother tested positive for the flu, do  need to take the Thermaflu as a precautious. Would like a call back.

## 2023-02-17 NOTE — Telephone Encounter (Addendum)
Pt said PCP suggested to check with neurologist to make sure Tamiflu (oseltamivir) 75 mg would not interact with Ocrevus or Baclofen. Patient called back with correct spelling of the medication.  Patient would like a call back

## 2023-02-17 NOTE — Telephone Encounter (Signed)
Called and stated ok to take tamiflu and she voiced understanding

## 2023-02-17 NOTE — Telephone Encounter (Signed)
No interactions reported in up to date. Should be fine to take the Tamiflu. Thanks

## 2023-03-02 DIAGNOSIS — F4323 Adjustment disorder with mixed anxiety and depressed mood: Secondary | ICD-10-CM | POA: Diagnosis not present

## 2023-03-12 DIAGNOSIS — F4323 Adjustment disorder with mixed anxiety and depressed mood: Secondary | ICD-10-CM | POA: Diagnosis not present

## 2023-03-18 DIAGNOSIS — F4323 Adjustment disorder with mixed anxiety and depressed mood: Secondary | ICD-10-CM | POA: Diagnosis not present

## 2023-03-26 DIAGNOSIS — F4323 Adjustment disorder with mixed anxiety and depressed mood: Secondary | ICD-10-CM | POA: Diagnosis not present

## 2023-04-09 DIAGNOSIS — F4323 Adjustment disorder with mixed anxiety and depressed mood: Secondary | ICD-10-CM | POA: Diagnosis not present

## 2023-04-16 DIAGNOSIS — F4323 Adjustment disorder with mixed anxiety and depressed mood: Secondary | ICD-10-CM | POA: Diagnosis not present

## 2023-04-23 DIAGNOSIS — G35 Multiple sclerosis: Secondary | ICD-10-CM | POA: Diagnosis not present

## 2023-05-07 DIAGNOSIS — F4323 Adjustment disorder with mixed anxiety and depressed mood: Secondary | ICD-10-CM | POA: Diagnosis not present

## 2023-05-21 DIAGNOSIS — F4323 Adjustment disorder with mixed anxiety and depressed mood: Secondary | ICD-10-CM | POA: Diagnosis not present

## 2023-06-04 DIAGNOSIS — F4323 Adjustment disorder with mixed anxiety and depressed mood: Secondary | ICD-10-CM | POA: Diagnosis not present

## 2023-06-15 ENCOUNTER — Telehealth: Payer: Self-pay | Admitting: Neurology

## 2023-06-15 DIAGNOSIS — G8114 Spastic hemiplegia affecting left nondominant side: Secondary | ICD-10-CM

## 2023-06-15 DIAGNOSIS — G35C0 Secondary progressive multiple sclerosis, unspecified: Secondary | ICD-10-CM

## 2023-06-15 DIAGNOSIS — G35 Multiple sclerosis: Secondary | ICD-10-CM

## 2023-06-15 DIAGNOSIS — R29898 Other symptoms and signs involving the musculoskeletal system: Secondary | ICD-10-CM

## 2023-06-15 NOTE — Telephone Encounter (Signed)
 I have placed the referral to PT at [facility]. Thanks

## 2023-06-15 NOTE — Telephone Encounter (Signed)
 Patient requesting referral for physical therapy due to MS. Want to keep muscle on the left side strengthen as much as I can.

## 2023-06-15 NOTE — Telephone Encounter (Signed)
 Called and spoke to pt and stated referral sent. Pt would like to know where its sent to. I stated that I would ask the referral coordinator and find out for sure

## 2023-06-17 NOTE — Addendum Note (Signed)
 Addended by: Wess Hammed on: 06/17/2023 02:04 PM   Modules accepted: Orders

## 2023-06-17 NOTE — Telephone Encounter (Signed)
 Referral reentered for PT in [place].   Orders Placed This Encounter  Procedures   Ambulatory referral to Physical Therapy

## 2023-06-17 NOTE — Telephone Encounter (Addendum)
 Referral for physical therapy fax to [facility]. Phone: [phone], Fax: [phone]  Pt requested referral to be sent for physical therapy in [place] area due to closer to home.

## 2023-06-22 DIAGNOSIS — F4323 Adjustment disorder with mixed anxiety and depressed mood: Secondary | ICD-10-CM | POA: Diagnosis not present

## 2023-06-30 DIAGNOSIS — F4323 Adjustment disorder with mixed anxiety and depressed mood: Secondary | ICD-10-CM | POA: Diagnosis not present

## 2023-07-01 DIAGNOSIS — F4323 Adjustment disorder with mixed anxiety and depressed mood: Secondary | ICD-10-CM | POA: Diagnosis not present

## 2023-07-13 DIAGNOSIS — R2689 Other abnormalities of gait and mobility: Secondary | ICD-10-CM | POA: Diagnosis not present

## 2023-07-13 DIAGNOSIS — M6281 Muscle weakness (generalized): Secondary | ICD-10-CM | POA: Diagnosis not present

## 2023-07-16 DIAGNOSIS — F4323 Adjustment disorder with mixed anxiety and depressed mood: Secondary | ICD-10-CM | POA: Diagnosis not present

## 2023-07-16 NOTE — Telephone Encounter (Signed)
 Pt orders for physical therapy fax to [facility]. Phone: [phone], Fax: [phone]

## 2023-07-20 DIAGNOSIS — M6281 Muscle weakness (generalized): Secondary | ICD-10-CM | POA: Diagnosis not present

## 2023-07-20 DIAGNOSIS — R2689 Other abnormalities of gait and mobility: Secondary | ICD-10-CM | POA: Diagnosis not present

## 2023-07-21 DIAGNOSIS — F4323 Adjustment disorder with mixed anxiety and depressed mood: Secondary | ICD-10-CM | POA: Diagnosis not present

## 2023-07-22 DIAGNOSIS — M6281 Muscle weakness (generalized): Secondary | ICD-10-CM | POA: Diagnosis not present

## 2023-07-22 DIAGNOSIS — R2689 Other abnormalities of gait and mobility: Secondary | ICD-10-CM | POA: Diagnosis not present

## 2023-07-28 DIAGNOSIS — R2689 Other abnormalities of gait and mobility: Secondary | ICD-10-CM | POA: Diagnosis not present

## 2023-07-28 DIAGNOSIS — M6281 Muscle weakness (generalized): Secondary | ICD-10-CM | POA: Diagnosis not present

## 2023-07-30 DIAGNOSIS — M6281 Muscle weakness (generalized): Secondary | ICD-10-CM | POA: Diagnosis not present

## 2023-07-30 DIAGNOSIS — R2689 Other abnormalities of gait and mobility: Secondary | ICD-10-CM | POA: Diagnosis not present

## 2023-07-30 DIAGNOSIS — F4323 Adjustment disorder with mixed anxiety and depressed mood: Secondary | ICD-10-CM | POA: Diagnosis not present

## 2023-08-04 ENCOUNTER — Telehealth: Payer: Self-pay | Admitting: Neurology

## 2023-08-04 DIAGNOSIS — M6281 Muscle weakness (generalized): Secondary | ICD-10-CM | POA: Diagnosis not present

## 2023-08-04 DIAGNOSIS — R2689 Other abnormalities of gait and mobility: Secondary | ICD-10-CM | POA: Diagnosis not present

## 2023-08-04 NOTE — Telephone Encounter (Signed)
 Request to r/s due to a conflict

## 2023-08-06 DIAGNOSIS — F4323 Adjustment disorder with mixed anxiety and depressed mood: Secondary | ICD-10-CM | POA: Diagnosis not present

## 2023-08-07 DIAGNOSIS — M6281 Muscle weakness (generalized): Secondary | ICD-10-CM | POA: Diagnosis not present

## 2023-08-07 DIAGNOSIS — R2689 Other abnormalities of gait and mobility: Secondary | ICD-10-CM | POA: Diagnosis not present

## 2023-08-11 DIAGNOSIS — M6281 Muscle weakness (generalized): Secondary | ICD-10-CM | POA: Diagnosis not present

## 2023-08-11 DIAGNOSIS — R2689 Other abnormalities of gait and mobility: Secondary | ICD-10-CM | POA: Diagnosis not present

## 2023-08-13 DIAGNOSIS — M6281 Muscle weakness (generalized): Secondary | ICD-10-CM | POA: Diagnosis not present

## 2023-08-13 DIAGNOSIS — F4323 Adjustment disorder with mixed anxiety and depressed mood: Secondary | ICD-10-CM | POA: Diagnosis not present

## 2023-08-13 DIAGNOSIS — R2689 Other abnormalities of gait and mobility: Secondary | ICD-10-CM | POA: Diagnosis not present

## 2023-08-18 ENCOUNTER — Ambulatory Visit: Admitting: Neurology

## 2023-08-18 DIAGNOSIS — R2689 Other abnormalities of gait and mobility: Secondary | ICD-10-CM | POA: Diagnosis not present

## 2023-08-18 DIAGNOSIS — M6281 Muscle weakness (generalized): Secondary | ICD-10-CM | POA: Diagnosis not present

## 2023-08-20 DIAGNOSIS — F4323 Adjustment disorder with mixed anxiety and depressed mood: Secondary | ICD-10-CM | POA: Diagnosis not present

## 2023-08-21 DIAGNOSIS — M6281 Muscle weakness (generalized): Secondary | ICD-10-CM | POA: Diagnosis not present

## 2023-08-21 DIAGNOSIS — R2689 Other abnormalities of gait and mobility: Secondary | ICD-10-CM | POA: Diagnosis not present

## 2023-08-25 ENCOUNTER — Ambulatory Visit: Payer: PPO | Admitting: Neurology

## 2023-08-25 DIAGNOSIS — M6281 Muscle weakness (generalized): Secondary | ICD-10-CM | POA: Diagnosis not present

## 2023-08-25 DIAGNOSIS — R2689 Other abnormalities of gait and mobility: Secondary | ICD-10-CM | POA: Diagnosis not present

## 2023-08-27 DIAGNOSIS — F4323 Adjustment disorder with mixed anxiety and depressed mood: Secondary | ICD-10-CM | POA: Diagnosis not present

## 2023-08-28 DIAGNOSIS — R2689 Other abnormalities of gait and mobility: Secondary | ICD-10-CM | POA: Diagnosis not present

## 2023-08-28 DIAGNOSIS — M6281 Muscle weakness (generalized): Secondary | ICD-10-CM | POA: Diagnosis not present

## 2023-09-01 DIAGNOSIS — R2689 Other abnormalities of gait and mobility: Secondary | ICD-10-CM | POA: Diagnosis not present

## 2023-09-01 DIAGNOSIS — M6281 Muscle weakness (generalized): Secondary | ICD-10-CM | POA: Diagnosis not present
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
## 2023-09-03 DIAGNOSIS — R2689 Other abnormalities of gait and mobility: Secondary | ICD-10-CM | POA: Diagnosis not present

## 2023-09-03 DIAGNOSIS — F4323 Adjustment disorder with mixed anxiety and depressed mood: Secondary | ICD-10-CM | POA: Diagnosis not present

## 2023-09-03 DIAGNOSIS — M6281 Muscle weakness (generalized): Secondary | ICD-10-CM | POA: Diagnosis not present

## 2023-09-04 ENCOUNTER — Encounter: Payer: Self-pay | Admitting: Neurology

## 2023-09-04 ENCOUNTER — Ambulatory Visit: Admitting: Neurology

## 2023-09-04 VITALS — BP 118/77 | HR 80 | Ht 63.0 in | Wt 150.0 lb

## 2023-09-04 DIAGNOSIS — G35 Multiple sclerosis: Secondary | ICD-10-CM | POA: Diagnosis not present

## 2023-09-04 DIAGNOSIS — G8114 Spastic hemiplegia affecting left nondominant side: Secondary | ICD-10-CM

## 2023-09-04 NOTE — Patient Instructions (Signed)
 Great to see you today! Check labs Continue current medications Continue exercise, physical therapy Follow-up in 6 months. Reach out if you need me.  Thanks!!

## 2023-09-05 LAB — COMPREHENSIVE METABOLIC PANEL WITH GFR
ALT: 21 IU/L (ref 0–32)
AST: 19 IU/L (ref 0–40)
Albumin: 4.5 g/dL (ref 3.9–4.9)
Alkaline Phosphatase: 74 IU/L (ref 44–121)
BUN/Creatinine Ratio: 15 (ref 12–28)
BUN: 10 mg/dL (ref 8–27)
Bilirubin Total: 0.4 mg/dL (ref 0.0–1.2)
CO2: 22 mmol/L (ref 20–29)
Calcium: 9.8 mg/dL (ref 8.7–10.3)
Chloride: 105 mmol/L (ref 96–106)
Creatinine, Ser: 0.68 mg/dL (ref 0.57–1.00)
Globulin, Total: 2.2 g/dL (ref 1.5–4.5)
Glucose: 90 mg/dL (ref 70–99)
Potassium: 4.6 mmol/L (ref 3.5–5.2)
Sodium: 143 mmol/L (ref 134–144)
Total Protein: 6.7 g/dL (ref 6.0–8.5)
eGFR: 94 mL/min/1.73 (ref >59)

## 2023-09-05 LAB — CBC WITH DIFFERENTIAL/PLATELET
Basophils Absolute: 0.1 x10E3/uL (ref 0.0–0.2)
Basos: 2 %
EOS (ABSOLUTE): 0.1 x10E3/uL (ref 0.0–0.4)
Eos: 3 %
Hematocrit: 43.2 % (ref 34.0–46.6)
Hemoglobin: 14 g/dL (ref 11.1–15.9)
Immature Grans (Abs): 0 x10E3/uL (ref 0.0–0.1)
Immature Granulocytes: 0 %
Lymphocytes Absolute: 0.7 x10E3/uL (ref 0.7–3.1)
Lymphs: 16 %
MCH: 32 pg (ref 26.6–33.0)
MCHC: 32.4 g/dL (ref 31.5–35.7)
MCV: 99 fL — ABNORMAL HIGH (ref 79–97)
Monocytes Absolute: 0.7 x10E3/uL (ref 0.1–0.9)
Monocytes: 16 %
Neutrophils Absolute: 2.9 x10E3/uL (ref 1.4–7.0)
Neutrophils: 63 %
Platelets: 261 x10E3/uL (ref 150–450)
RBC: 4.38 x10E6/uL (ref 3.77–5.28)
RDW: 11.5 % — ABNORMAL LOW (ref 11.7–15.4)
WBC: 4.5 x10E3/uL (ref 3.4–10.8)

## 2023-09-05 LAB — HIV ANTIBODY (ROUTINE TESTING W REFLEX): HIV Screen 4th Generation wRfx: NONREACTIVE

## 2023-09-05 LAB — VITAMIN D 25 HYDROXY (VIT D DEFICIENCY, FRACTURES): Vit D, 25-Hydroxy: 58.1 ng/mL (ref 30.0–100.0)

## 2023-09-05 LAB — IGG, IGA, IGM
IgA/Immunoglobulin A, Serum: 138 mg/dL (ref 87–352)
IgG (Immunoglobin G), Serum: 962 mg/dL (ref 586–1602)
IgM (Immunoglobulin M), Srm: 42 mg/dL (ref 26–217)

## 2023-09-05 LAB — RPR: RPR Ser Ql: NONREACTIVE

## 2023-09-07 ENCOUNTER — Ambulatory Visit: Payer: Self-pay | Admitting: Neurology

## 2023-09-08 DIAGNOSIS — M6281 Muscle weakness (generalized): Secondary | ICD-10-CM | POA: Diagnosis not present

## 2023-09-08 DIAGNOSIS — R2689 Other abnormalities of gait and mobility: Secondary | ICD-10-CM | POA: Diagnosis not present

## 2023-09-09 ENCOUNTER — Telehealth: Payer: Self-pay

## 2023-09-09 NOTE — Telephone Encounter (Signed)
 Signed report faxed to [facility]

## 2023-09-10 DIAGNOSIS — M6281 Muscle weakness (generalized): Secondary | ICD-10-CM | POA: Diagnosis not present

## 2023-09-10 DIAGNOSIS — F4323 Adjustment disorder with mixed anxiety and depressed mood: Secondary | ICD-10-CM | POA: Diagnosis not present

## 2023-09-10 DIAGNOSIS — R2689 Other abnormalities of gait and mobility: Secondary | ICD-10-CM | POA: Diagnosis not present

## 2023-09-15 ENCOUNTER — Other Ambulatory Visit: Payer: Self-pay | Admitting: Neurology

## 2023-09-15 DIAGNOSIS — M6281 Muscle weakness (generalized): Secondary | ICD-10-CM | POA: Diagnosis not present

## 2023-09-15 DIAGNOSIS — R2689 Other abnormalities of gait and mobility: Secondary | ICD-10-CM | POA: Diagnosis not present

## 2023-09-15 MED ORDER — OCREVUS 300 MG/10ML IV SOLN: INTRAVENOUS | 1 refills | Status: AC

## 2023-09-15 NOTE — Telephone Encounter (Signed)
 New script for ocrevus  to send to [facility] was requested by [facility] from [facility]

## 2023-09-17 DIAGNOSIS — F4323 Adjustment disorder with mixed anxiety and depressed mood: Secondary | ICD-10-CM | POA: Diagnosis not present

## 2023-09-17 DIAGNOSIS — R2689 Other abnormalities of gait and mobility: Secondary | ICD-10-CM | POA: Diagnosis not present

## 2023-09-17 DIAGNOSIS — M6281 Muscle weakness (generalized): Secondary | ICD-10-CM | POA: Diagnosis not present

## 2023-09-22 DIAGNOSIS — M6281 Muscle weakness (generalized): Secondary | ICD-10-CM | POA: Diagnosis not present

## 2023-09-22 DIAGNOSIS — R2689 Other abnormalities of gait and mobility: Secondary | ICD-10-CM | POA: Diagnosis not present

## 2023-09-22 NOTE — Telephone Encounter (Signed)
 Jennifer from [facility] called to get Clarification  on Pt medication ,   Delon call back is  [phone]

## 2023-09-24 ENCOUNTER — Other Ambulatory Visit (HOSPITAL_BASED_OUTPATIENT_CLINIC_OR_DEPARTMENT_OTHER): Admitting: Radiology

## 2023-09-24 DIAGNOSIS — F4323 Adjustment disorder with mixed anxiety and depressed mood: Secondary | ICD-10-CM | POA: Diagnosis not present

## 2023-09-24 DIAGNOSIS — R2689 Other abnormalities of gait and mobility: Secondary | ICD-10-CM | POA: Diagnosis not present

## 2023-09-24 DIAGNOSIS — M6281 Muscle weakness (generalized): Secondary | ICD-10-CM | POA: Diagnosis not present

## 2023-09-29 DIAGNOSIS — R2689 Other abnormalities of gait and mobility: Secondary | ICD-10-CM | POA: Diagnosis not present

## 2023-09-29 DIAGNOSIS — M6281 Muscle weakness (generalized): Secondary | ICD-10-CM | POA: Diagnosis not present

## 2023-10-01 DIAGNOSIS — F4323 Adjustment disorder with mixed anxiety and depressed mood: Secondary | ICD-10-CM | POA: Diagnosis not present

## 2023-10-01 DIAGNOSIS — R2689 Other abnormalities of gait and mobility: Secondary | ICD-10-CM | POA: Diagnosis not present

## 2023-10-01 DIAGNOSIS — M6281 Muscle weakness (generalized): Secondary | ICD-10-CM | POA: Diagnosis not present

## 2023-10-08 DIAGNOSIS — F4323 Adjustment disorder with mixed anxiety and depressed mood: Secondary | ICD-10-CM | POA: Diagnosis not present

## 2023-10-15 DIAGNOSIS — F4323 Adjustment disorder with mixed anxiety and depressed mood: Secondary | ICD-10-CM | POA: Diagnosis not present

## 2023-10-22 DIAGNOSIS — F4323 Adjustment disorder with mixed anxiety and depressed mood: Secondary | ICD-10-CM | POA: Diagnosis not present

## 2023-10-27 DIAGNOSIS — G35C1 Active secondary progressive multiple sclerosis: Secondary | ICD-10-CM | POA: Diagnosis not present

## 2023-11-05 DIAGNOSIS — F4323 Adjustment disorder with mixed anxiety and depressed mood: Secondary | ICD-10-CM | POA: Diagnosis not present

## 2023-11-12 DIAGNOSIS — F4323 Adjustment disorder with mixed anxiety and depressed mood: Secondary | ICD-10-CM | POA: Diagnosis not present

## 2023-11-19 ENCOUNTER — Ambulatory Visit (INDEPENDENT_AMBULATORY_CARE_PROVIDER_SITE_OTHER)
Admission: RE | Admit: 2023-11-19 | Discharge: 2023-11-19 | Disposition: A | Discharge status: Home / Self Care | Source: Ambulatory Visit | Attending: Neurology | Admitting: Neurology

## 2023-11-19 DIAGNOSIS — G8114 Spastic hemiplegia affecting left nondominant side: Secondary | ICD-10-CM | POA: Diagnosis not present

## 2023-11-19 DIAGNOSIS — G35C0 Secondary progressive multiple sclerosis, unspecified: Secondary | ICD-10-CM | POA: Diagnosis not present

## 2023-11-19 DIAGNOSIS — G8190 Hemiplegia, unspecified affecting unspecified side: Secondary | ICD-10-CM | POA: Diagnosis not present

## 2023-11-19 DIAGNOSIS — G35D Multiple sclerosis, unspecified: Secondary | ICD-10-CM | POA: Diagnosis not present

## 2023-11-19 DIAGNOSIS — F4323 Adjustment disorder with mixed anxiety and depressed mood: Secondary | ICD-10-CM | POA: Diagnosis not present

## 2023-11-19 MED ORDER — GADOBUTROL 1 MMOL/ML IV SOLN
7.0000 mL | Freq: Once | INTRAVENOUS | Status: AC | PRN
  Administered 2023-11-19: 7 mL via INTRAVENOUS

## 2023-11-26 DIAGNOSIS — F4323 Adjustment disorder with mixed anxiety and depressed mood: Secondary | ICD-10-CM | POA: Diagnosis not present

## 2023-12-03 DIAGNOSIS — F4323 Adjustment disorder with mixed anxiety and depressed mood: Secondary | ICD-10-CM | POA: Diagnosis not present

## 2023-12-09 DIAGNOSIS — F4323 Adjustment disorder with mixed anxiety and depressed mood: Secondary | ICD-10-CM | POA: Diagnosis not present

## 2024-03-29 ENCOUNTER — Ambulatory Visit: Admitting: Neurology
